# Patient Record
Sex: Female | Born: 1995
Health system: Southern US, Community
[De-identification: ages and names within clinical notes are randomized; demographics above are authoritative.]

## PROBLEM LIST (undated history)

## (undated) HISTORY — PX: WISDOM TOOTH EXTRACTION: SHX21

---
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
## 2013-10-13 ENCOUNTER — Encounter: Payer: Self-pay | Admitting: Obstetrics & Gynecology

## 2013-10-13 ENCOUNTER — Ambulatory Visit (INDEPENDENT_AMBULATORY_CARE_PROVIDER_SITE_OTHER): Admitting: Obstetrics & Gynecology

## 2013-10-13 VITALS — BP 118/74 | HR 67 | Temp 98.6°F | Ht 65.0 in | Wt 159.0 lb

## 2013-10-13 DIAGNOSIS — Z01419 Encounter for gynecological examination (general) (routine) without abnormal findings: Secondary | ICD-10-CM

## 2013-10-13 DIAGNOSIS — Z113 Encounter for screening for infections with a predominantly sexual mode of transmission: Secondary | ICD-10-CM

## 2013-10-13 NOTE — Progress Notes (Signed)
Subjective:     Holly Tran is a 17 y.o. female here for a routine annual exam.  Current complaints: Pt does frequently have yeast infections. Pt would like STD testing at today's visit.  Pt currently satisfied with her birth control. No other complaints today.  Personal health questionnaire reviewed: yes.   Gynecologic History Patient's last menstrual period was 10/06/2013. Contraception: pill Last Pap:  No previous paps Last mammogram: n/a  Obstetric History OB History  Gravida Para Term Preterm AB SAB TAB Ectopic Multiple Living  0 0 0 0 0 0 0 0 0 0          The following portions of the patient's history were reviewed and updated as appropriate: allergies, current medications, past family history, past medical history, past social history, past surgical history and problem list.  Review of Systems Pertinent items are noted in HPI.    Objective:   General appearance: alert Breasts: normal appearance, no masses or tenderness Abdomen: soft, non-tender; bowel sounds normal; no masses,  no organomegaly Pelvic: cervix normal in appearance, external genitalia normal, no adnexal masses or tenderness, uterus normal size, shape, and consistency and vagina normal without discharge      Assessment:    Healthy female exam.    Plan:    Return in 75yr

## 2013-10-13 NOTE — Patient Instructions (Signed)
Health Maintenance, 18- to 19-Year-Old SCHOOL PERFORMANCE After high school completion, the young adult may be attending college, technical or vocational school, or entering the military or the work force. SOCIAL AND EMOTIONAL DEVELOPMENT The young adult establishes adult relationships and explores sexual identity. Young adults may be living at home or in a college dorm or apartment. Increasing independence is important with young adults. Throughout these years, young adults should assume responsibility of their own health care. RECOMMENDED IMMUNIZATIONS  Influenza vaccine.  All adults should be immunized every year.  All adults, including pregnant women and people with hives-only allergy to eggs can receive the inactivated influenza (IIV) vaccine.  Adults aged 18 49 years can receive the recombinant influenza (RIV) vaccine. The RIV vaccine does not contain any egg protein.  Tetanus, diphtheria, and acellular pertussis (Td, Tdap) vaccine.  Pregnant women should receive 1 dose of Tdap vaccine during each pregnancy. The dose should be obtained regardless of the length of time since the last dose. Immunization is preferred during the 27th to 36th week of gestation.  An adult who has not previously received Tdap or who does not know his or her vaccine status should receive 1 dose of Tdap. This initial dose should be followed by tetanus and diphtheria toxoids (Td) booster doses every 10 years.  Adults with an unknown or incomplete history of completing a 3-dose immunization series with Td-containing vaccines should begin or complete a primary immunization series including a Tdap dose.  Adults should receive a Td booster every 10 years.  Varicella vaccine.  An adult without evidence of immunity to varicella should receive 2 doses or a second dose if he or she has previously received 1 dose.  Pregnant females who do not have evidence of immunity should receive the first dose after pregnancy.  This first dose should be obtained before leaving the health care facility. The second dose should be obtained 4 8 weeks after the first dose.  Human papillomavirus (HPV) vaccine.  Females aged 13 26 years who have not received the vaccine previously should obtain the 3-dose series.  The vaccine is not recommended for use in pregnant females. However, pregnancy testing is not needed before receiving a dose. If a female is found to be pregnant after receiving a dose, no treatment is needed. In that case, the remaining doses should be delayed until after the pregnancy.  Males aged 13 21 years who have not received the vaccine previously should receive the 3-dose series. Males aged 22 26 years may be immunized.  Immunization is recommended through the age of 26 years for any female who has sex with males and did not get any or all doses earlier.  Immunization is recommended for any person with an immunocompromised condition through the age of 26 years if he or she did not get any or all doses earlier.  During the 3-dose series, the second dose should be obtained 4 8 weeks after the first dose. The third dose should be obtained 24 weeks after the first dose and 16 weeks after the second dose.  Measles, mumps, and rubella (MMR) vaccine.  Adults born in 1957 or later should have 1 or more doses of MMR vaccine unless there is a contraindication to the vaccine or there is laboratory evidence of immunity to each of the three diseases.  A routine second dose of MMR vaccine should be obtained at least 28 days after the first dose for students attending postsecondary schools, health care workers, or international travelers.    For females of childbearing age, rubella immunity should be determined. If there is no evidence of immunity, females who are not pregnant should be vaccinated. If there is no evidence of immunity, females who are pregnant should delay immunization until after pregnancy.  Pneumococcal  13-valent conjugate (PCV13) vaccine.  When indicated, a person who is uncertain of his or her immunization history and has no record of immunization should receive the PCV13 vaccine.  An adult aged 19 years or older who has certain medical conditions and has not been previously immunized should receive 1 dose of PCV13 vaccine. This PCV13 should be followed with a dose of pneumococcal polysaccharide (PPSV23) vaccine. The PPSV23 vaccine dose should be obtained at least 8 weeks after the dose of PCV13 vaccine.  An adult aged 19 years or older who has certain medical conditions and previously received 1 or more doses of PPSV23 vaccine should receive 1 dose of PCV13. The PCV13 vaccine dose should be obtained 1 or more years after the last PPSV23 vaccine dose.  Pneumococcal polysaccharide (PPSV23) vaccine.  When PCV13 is also indicated, PCV13 should be obtained first.  An adult younger than age 65 years who has certain medical conditions should be immunized.  Any person who resides in a nursing home or long-term care facility should be immunized.  An adult smoker should be immunized.  People with an immunocompromised condition and certain other conditions should receive both PCV13 and PPSV23 vaccines.  People with human immunodeficiency virus (HIV) infection should be immunized as soon as possible after diagnosis.  Immunization during chemotherapy or radiation therapy should be avoided.  Routine use of PPSV23 vaccine is not recommended for American Indians, [place] Natives, or people younger than 65 years unless there are medical conditions that require PPSV23 vaccine.  When indicated, people who have unknown immunization and have no record of immunization should receive PPSV23 vaccine.  One-time revaccination 5 years after the first dose of PPSV23 is recommended for people aged 19 64 years who have chronic kidney failure, nephrotic syndrome, asplenia, or immunocompromised  conditions.  Meningococcal vaccine.  Adults with asplenia or persistent complement component deficiencies should receive 2 doses of quadrivalent meningococcal conjugate (MenACWY-D) vaccine. The doses should be obtained at least 2 months apart.  Microbiologists working with certain meningococcal bacteria, military recruits, people at risk during an outbreak, and people who travel to or live in countries with a high rate of meningitis should be immunized.  A first-year college student up through age 19 years who is living in a residence hall should receive a dose if he or she did not receive a dose on or after his or her 16th birthday.  Adults who have certain high-risk conditions should receive one or more doses of vaccine.  Hepatitis A vaccine.  Adults who wish to be protected from this disease, have certain high-risk conditions, work with hepatitis A-infected animals, work in hepatitis A research labs, or travel to or work in countries with a high rate of hepatitis A should be immunized.  Adults who were previously unvaccinated and who anticipate close contact with an international adoptee during the first 60 days after arrival in the [place] from a country with a high rate of hepatitis A should be immunized.  Hepatitis B vaccine.  Adults who wish to be protected from this disease, have certain high-risk conditions, may be exposed to blood or other infectious body fluids, are household contacts or sex partners of hepatitis B positive people, are clients or workers in   certain care facilities, or travel to or work in countries with a high rate of hepatitis B should be immunized.  Haemophilus influenzae type b (Hib) vaccine.  A previously unvaccinated person with asplenia or sickle cell disease or having a scheduled splenectomy should receive 1 dose of Hib vaccine.  Regardless of previous immunization, a recipient of a hematopoietic stem cell transplant should receive a 3-dose series 6  12 months after his or her successful transplant.  Hib vaccine is not recommended for adults with HIV infection. TESTING Annual screening for vision and hearing problems is recommended. Vision should be screened objectively at least once between 18 19 years of age. The young adult may be screened for anemia or tuberculosis. Young adults should have a blood test to check for high cholesterol during this time period. Young adults should be screened for use of alcohol and drugs. If the young adult is sexually active, screening for sexually transmitted infections, pregnancy, or HIV may be performed.  NUTRITION AND ORAL HEALTH  Adequate calcium intake is important. Consume 3 servings of low-fat milk and dairy products daily. For those who do not drink milk or consume dairy products, calcium enriched foods, such as juice, bread, or cereal, dark, leafy greens, or canned fish are alternate sources of calcium.  Drink plenty of water. Limit fruit juice to 8 12 ounces (240 360 mL) each day. Avoid sugary beverages or sodas.  Discourage skipping meals, especially breakfast. Young adults should eat a good variety of vegetables and fruits, as well as lean meats.  Avoid foods high in fat, salt, or sugar, such as candy, chips, and cookies.  Encourage young adults to participate in meal planning and preparation.  Eat meals together as a family whenever possible. Encourage conversation at mealtime.  Limit fast food choices and eating out at restaurants.  Brush teeth twice a day and floss.  Schedule dental exams twice a year. SLEEP Regular sleep habits are important. PHYSICAL, SOCIAL, AND EMOTIONAL DEVELOPMENT  One hour of regular physical activity daily is recommended. Continue to participate in sports.  Encourage young adults to develop their own interests and consider community service or volunteerism.  Provide guidance to the young adult in making decisions about college and work plans.  Make sure  that young adults know that they should never be in a situation that makes them uncomfortable, and they should tell partners if they do not want to engage in sexual activity.  Talk to the young adult about body image. Eating disorders may be noted at this time. Young adults may also be concerned about being overweight. Monitor the young adult for weight gain or loss.  Mood disturbances, depression, anxiety, alcoholism, or attention problems may be noted in young adults. Talk to the caregiver if there are concerns about mental illness.  Negotiate limit setting and independent decision making.  Encourage the young adult to handle conflict without physical violence.  Avoid loud noises which may impair hearing.  Limit television and computer time to 2 hours each day. Individuals who engage in excessive sedentary activity are more likely to become overweight. RISK BEHAVIORS  Sexually active young adults need to take precautions against pregnancy and sexually transmitted infections. Talk to young adults about contraception.  Provide a tobacco-free and drug-free environment for the young adult. Talk to the young adult about drug, tobacco, and alcohol use among friends or at friend's homes. Make sure the young adult knows that smoking tobacco or marijuana and taking drugs have health consequences and   may impact brain development.  Teach the young adult about appropriate use of over-the-counter or prescription medicines.  Establish guidelines for driving and for riding with friends.  Talk to young adults about the risks of drinking and driving or boating. Encourage the young adult to call you if he or she or friends have been drinking or using drugs.  Remind young adults to wear seat belts at all times in cars and life vests in boats.  Young adults should always wear a properly fitted helmet when they are riding a bicycle.  Use caution with all-terrain vehicles (ATVs) or other motorized  vehicles.  Do not keep handguns in the home. (If you do, the gun and ammunition should be locked separately and out of the young adult's access.)  Equip your home with smoke detectors and change the batteries regularly. Make sure all family members know the fire escape plans for your home.  Teach young adults not to swim alone and not to dive in shallow water.  All individuals should wear sunscreen when out in the sun. This minimizes sunburning. WHAT'S NEXT? Young adults should visit their pediatrician or family physician yearly. By young adulthood, health care should be transitioned to a family physician or internal medicine specialist. Sexually active females may want to begin annual physical exams with a gynecologist. Document Released: 01/09/2007 Document Revised: 02/08/2013 Document Reviewed: 01/29/2007 [employer] Patient Information 2014 [employer].  

## 2013-10-14 LAB — WET PREP BY MOLECULAR PROBE
Candida species: NEGATIVE
Gardnerella vaginalis: NEGATIVE
Trichomonas vaginosis: NEGATIVE

## 2013-10-14 LAB — GC/CHLAMYDIA PROBE AMP: GC Probe RNA: NEGATIVE

## 2014-08-10 ENCOUNTER — Encounter: Payer: Self-pay | Admitting: Obstetrics & Gynecology

## 2014-08-10 ENCOUNTER — Ambulatory Visit (INDEPENDENT_AMBULATORY_CARE_PROVIDER_SITE_OTHER): Admitting: Obstetrics & Gynecology

## 2014-08-10 VITALS — BP 123/77 | HR 89 | Temp 98.8°F | Ht 65.0 in | Wt 170.0 lb

## 2014-08-10 DIAGNOSIS — N760 Acute vaginitis: Secondary | ICD-10-CM

## 2014-08-10 MED ORDER — METRONIDAZOLE 500 MG PO TABS: 500.0000 mg | ORAL_TABLET | Freq: Two times a day (BID) | ORAL | Status: DC

## 2014-08-10 MED ORDER — METRONIDAZOLE 500 MG PO TABS: 500.0000 mg | ORAL_TABLET | Freq: Three times a day (TID) | ORAL | Status: DC

## 2014-08-11 LAB — HIV ANTIBODY (ROUTINE TESTING W REFLEX): HIV 1&2 Ab, 4th Generation: NONREACTIVE

## 2014-08-11 LAB — WET PREP BY MOLECULAR PROBE
CANDIDA SPECIES: NEGATIVE
GARDNERELLA VAGINALIS: POSITIVE — AB
TRICHOMONAS VAG: NEGATIVE

## 2014-08-11 LAB — GC/CHLAMYDIA PROBE AMP
CT Probe RNA: NEGATIVE
GC Probe RNA: NEGATIVE

## 2014-08-11 LAB — RPR

## 2014-08-12 NOTE — Patient Instructions (Signed)

## 2014-08-12 NOTE — Progress Notes (Signed)
Patient ID: Holly Tran, female   DOB: August 16, 1996, 18 y.o.   MRN: [id number]  Chief Complaint  Patient presents with  . Problem    Possible Yeast Infection     HPI Holly Greggory StallionGeorge is Tran 18 y.o. female.  C/O vaginal discharge, irritation.  HPI  History reviewed. No pertinent past medical history.  History reviewed. No pertinent past surgical history.  Family History  Problem Relation Age of Onset  . Diabetes Mother   . Diabetes Brother   . Diabetes Maternal Grandmother   . Cancer Maternal Grandmother   . Diabetes Maternal Grandfather   . Cancer Paternal Grandmother     Social History History  Substance Use Topics  . Smoking status: Light Tobacco Smoker    Types: Cigarettes  . Smokeless tobacco: Never Used     Comment: smokes occasionally  . Alcohol Use: No    No Known Allergies  Current Outpatient Prescriptions  Medication Sig Dispense Refill  . naproxen (NAPROSYN) 250 MG tablet Take by mouth.      . metroNIDAZOLE (FLAGYL) 500 MG tablet Take 1 tablet (500 mg total) by mouth 2 (two) times daily.  14 tablet  0   No current facility-administered medications for this visit.    Review of Systems Review of Systems Constitutional: negative for fatigue and weight loss Respiratory: negative for cough and wheezing Cardiovascular: negative for chest pain, fatigue and palpitations Gastrointestinal: negative for abdominal pain and change in bowel habits Genitourinary:positive for abnormal vaginal discharge Integument/breast: negative for nipple discharge Musculoskeletal:negative for myalgias Neurological: negative for gait problems and tremors Behavioral/Psych: negative for abusive relationship, depression Endocrine: negative for temperature intolerance     Blood pressure 123/77, pulse 89, temperature 98.8 F (37.1 C), height 5\' 5"  (1.651 m), weight 77.111 kg (170 lb), last menstrual period 08/10/2014.  Physical Exam Physical Exam General:   alert  Skin:   no rash or  abnormalities  Lungs:   clear to auscultation bilaterally  Heart:   regular rate and rhythm, S1, S2 normal, no murmur, click, rub or gallop  Abdomen:  normal findings: no organomegaly, soft, non-tender and no hernia  Pelvis:  External genitalia: normal general appearance Urinary system: urethral meatus normal and bladder without fullness, nontender Vaginal: thin, white discharge--malodorous, strong amine odor Cervix: normal appearance Adnexa: normal bimanual exam Uterus: anteverted and non-tender, normal size      Data Reviewed none  Assessment    Likely bacterial vaginosis     Plan    Orders Placed This Encounter  Procedures  . WET PREP BY MOLECULAR PROBE  . GC/Chlamydia Probe Amp  . HIV antibody  . RPR   Meds ordered this encounter  Medications  . DISCONTD: metroNIDAZOLE (FLAGYL) 500 MG tablet    Sig: Take 1 tablet (500 mg total) by mouth 3 (three) times daily.    Dispense:  14 tablet    Refill:  0  . metroNIDAZOLE (FLAGYL) 500 MG tablet    Sig: Take 1 tablet (500 mg total) by mouth 2 (two) times daily.    Dispense:  14 tablet    Refill:  0     Follow up as needed.         Holly Tran 08/12/2014, 11:37 AM

## 2014-08-22 ENCOUNTER — Telehealth: Payer: Self-pay | Admitting: *Deleted

## 2014-08-22 NOTE — Telephone Encounter (Signed)
Pt placed call to office for lab results. Return call to pt. Pt made aware of results and advised to take Rx that she was given at time of visit.

## 2014-09-01 ENCOUNTER — Ambulatory Visit (INDEPENDENT_AMBULATORY_CARE_PROVIDER_SITE_OTHER): Admitting: Physician Assistant

## 2014-09-01 VITALS — BP 126/80 | HR 89 | Temp 98.4°F | Resp 16 | Ht 65.0 in | Wt 169.2 lb

## 2014-09-01 DIAGNOSIS — L089 Local infection of the skin and subcutaneous tissue, unspecified: Secondary | ICD-10-CM

## 2014-09-01 MED ORDER — DOXYCYCLINE HYCLATE 100 MG PO CAPS: 100.0000 mg | ORAL_CAPSULE | Freq: Two times a day (BID) | ORAL | Status: AC

## 2014-09-01 NOTE — Patient Instructions (Addendum)
Stay out of the sun while taking the doxycycline.     You may take advil for pain.    If you witness worsening swelling at the site, muscle ache or fever, return to the clinic.

## 2014-09-01 NOTE — Progress Notes (Signed)
   Subjective:    Patient ID: Holly Tran, female    DOB: August 25, 1996, 18 y.o.   MRN: [id number]   HPI  18 year old female is here today for a bump on her leg.   This began yesterday after visiting a friend's house.  When she got home, she noticed that she had a swollen bump on the left side of her buttocks.  She said it was painful to the touch.  There is a punctured site which she said had some pus drainage. She tried to put alcohol on it, which decreased the swelling, but it still hurts.  She denies fever, myalgia, malaise, or rash.  She denies that it was began target-like in appearance--just a general circular swelling under the skin.  She does not note any sign of tics throughout the last few weeks.  She did not feel the actual bite, or notice any spiders or insects.   She is not on any other medications, no use of anti-inflammatories.  She had chills and myalgia which prompted her to come here today.   Review of Systems ROS otherwise normal unless listed above    Objective:   Physical Exam  Constitutional: She is oriented to person, place, and time. She appears well-developed and well-nourished. No distress.  BP 126/80 mmHg  Pulse 89  Temp(Src) 98.4 F (36.9 C) (Oral)  Resp 16  Ht 5\' 5"  (1.651 m)  Wt 169 lb 3.2 oz (76.749 kg)  BMI 28.16 kg/m2  SpO2 98%  LMP 08/10/2014   HENT:  Head: Normocephalic and atraumatic.  Eyes: Pupils are equal, round, and reactive to light.  Cardiovascular: Normal rate, normal heart sounds and intact distal pulses.   Pulmonary/Chest: Effort normal and breath sounds normal. No respiratory distress. She has no wheezes.  Lymphadenopathy:       Right: No inguinal adenopathy present.       Left: No inguinal adenopathy present.  Neurological: She is alert and oriented to person, place, and time. She displays normal reflexes (Normal lower extremity DTRs).  Skin: Skin is warm and dry. No rash noted.  Left lateral gluteal swelling 2 inches diameter.     Around a puncture site.  No erythema.  Puncture site about 2mm wide.  No erythema migrans.    Psychiatric: She has a normal mood and affect. Her behavior is normal. Judgment and thought content normal.          Assessment & Plan:  18 year old female is here today for complaint of possible spider bite.  This appears like a bite of unknown organism, but it also looks like cellulitis, though there is a definite location of entry for the pathogen that is causing this inflammation.  We will treat her with comparable antibiotic for both an insect bite while covering for MRSA.    Skin infection - Plan: doxycycline (VIBRAMYCIN) 100 MG capsule 10 days BID She will return if symptoms worsen or fail to improve.  Trena PlattStephanie English, PA-C [facility] 11/5/20157:48 PM

## 2014-09-02 NOTE — Progress Notes (Signed)
I was directly involved with the patient's care and agree with the physical, diagnosis and treatment plan.  

## 2014-10-13 ENCOUNTER — Ambulatory Visit (INDEPENDENT_AMBULATORY_CARE_PROVIDER_SITE_OTHER): Admitting: Obstetrics & Gynecology

## 2014-10-13 ENCOUNTER — Encounter: Payer: Self-pay | Admitting: Obstetrics & Gynecology

## 2014-10-13 ENCOUNTER — Ambulatory Visit: Admitting: Obstetrics & Gynecology

## 2014-10-13 ENCOUNTER — Other Ambulatory Visit: Payer: Self-pay | Admitting: Obstetrics & Gynecology

## 2014-10-13 VITALS — BP 126/78 | HR 84 | Temp 98.6°F | Ht 66.0 in | Wt 171.0 lb

## 2014-10-13 DIAGNOSIS — Z01419 Encounter for gynecological examination (general) (routine) without abnormal findings: Secondary | ICD-10-CM

## 2014-10-13 LAB — CBC
HEMATOCRIT: 36.3 % (ref 36.0–46.0)
Hemoglobin: 11.7 g/dL — ABNORMAL LOW (ref 12.0–15.0)
MCH: 25.3 pg — ABNORMAL LOW (ref 26.0–34.0)
MCHC: 32.2 g/dL (ref 30.0–36.0)
MCV: 78.6 fL (ref 78.0–100.0)
MPV: 10 fL (ref 9.4–12.4)
Platelets: 367 10*3/uL (ref 150–400)
RBC: 4.62 MIL/uL (ref 3.87–5.11)
RDW: 13.6 % (ref 11.5–15.5)
WBC: 7.2 10*3/uL (ref 4.0–10.5)

## 2014-10-13 NOTE — Patient Instructions (Signed)
You Can Quit Smoking If you are ready to quit smoking or are thinking about it, congratulations! You have chosen to help yourself be healthier and live longer! There are lots of different ways to quit smoking. Nicotine gum, nicotine patches, a nicotine inhaler, or nicotine nasal spray can help with physical craving. Hypnosis, support groups, and medicines help break the habit of smoking. TIPS TO GET OFF AND STAY OFF CIGARETTES  Learn to predict your moods. Do not let a bad situation be your excuse to have a cigarette. Some situations in your life might tempt you to have a cigarette.  Ask friends and co-workers not to smoke around you.  Make your home smoke-free.  Never have "just one" cigarette. It leads to wanting another and another. Remind yourself of your decision to quit.  On a card, make a list of your reasons for not smoking. Read it at least the same number of times a day as you have a cigarette. Tell yourself everyday, "I do not want to smoke. I choose not to smoke."  Ask someone at home or work to help you with your plan to quit smoking.  Have something planned after you eat or have a cup of coffee. Take a walk or get other exercise to perk you up. This will help to keep you from overeating.  Try a relaxation exercise to calm you down and decrease your stress. Remember, you may be tense and nervous the first two weeks after you quit. This will pass.  Find new activities to keep your hands busy. Play with a pen, coin, or rubber band. Doodle or draw things on paper.  Brush your teeth right after eating. This will help cut down the craving for the taste of tobacco after meals. You can try mouthwash too.  Try gum, breath mints, or diet candy to keep something in your mouth. IF YOU SMOKE AND WANT TO QUIT:  Do not stock up on cigarettes. Never buy a carton. Wait until one pack is finished before you buy another.  Never carry cigarettes with you at work or at home.  Keep cigarettes  as far away from you as possible. Leave them with someone else.  Never carry matches or a lighter with you.  Ask yourself, "Do I need this cigarette or is this just a reflex?"  Bet with someone that you can quit. Put cigarette money in a piggy bank every morning. If you smoke, you give up the money. If you do not smoke, by the end of the week, you keep the money.  Keep trying. It takes 21 days to change a habit!  Talk to your doctor about using medicines to help you quit. These include nicotine replacement gum, lozenges, or skin patches. Document Released: 08/10/2009 Document Revised: 01/06/2012 Document Reviewed: 08/10/2009 Exit[employer] Patient Information 2015 [employer]. This information is not intended to replace advice given to you by your health care provider. Make sure you discuss any questions you have with your health care provider.  

## 2014-10-13 NOTE — Progress Notes (Signed)
Subjective:     Holly Tran is a 18 y.o. female here for a routine exam.     Personal health questionnaire:  Is patient [name], have a family history of breast and/or ovarian cancer: no Is there a family history of uterine cancer diagnosed at age < 6250, gastrointestinal cancer, urinary tract cancer, family member who is a Personnel officerLynch syndrome-associated carrier: no Is the patient overweight and hypertensive, family history of diabetes, personal history of gestational diabetes or PCOS: yes Is patient over 4055, have PCOS,  family history of premature CHD under age 20, diabetes, smoke, have hypertension or peripheral artery disease:  yes At any time, has a partner hit, kicked or otherwise hurt or frightened you?: no Over the past 2 weeks, have you felt down, depressed or hopeless?: no Over the past 2 weeks, have you felt little interest or pleasure in doing things?:no   Gynecologic History Patient's last menstrual period was 09/29/2014.   Obstetric History OB History  Gravida Para Term Preterm AB SAB TAB Ectopic Multiple Living  0 0 0 0 0 0 0 0 0 0         History reviewed. No pertinent past medical history.  History reviewed. No pertinent past surgical history.  Current outpatient prescriptions: naproxen (NAPROSYN) 250 MG tablet, Take by mouth., Disp: , Rfl:  No Known Allergies  History  Substance Use Topics  . Smoking status: Light Tobacco Smoker    Types: Cigarettes  . Smokeless tobacco: Never Used     Comment: smokes occasionally  . Alcohol Use: No    Family History  Problem Relation Age of Onset  . Diabetes Mother   . Diabetes Brother   . Diabetes Maternal Grandmother   . Cancer Maternal Grandmother   . Diabetes Maternal Grandfather   . Cancer Paternal Grandmother       Review of Systems  Constitutional: negative for fatigue and weight loss Respiratory: negative for cough and wheezing Cardiovascular: negative for chest pain, fatigue and  palpitations Gastrointestinal: negative for abdominal pain and change in bowel habits Musculoskeletal:negative for myalgias Neurological: negative for gait problems and tremors Behavioral/Psych: negative for abusive relationship, depression Endocrine: negative for temperature intolerance   Genitourinary:negative for abnormal menstrual periods, genital lesions, hot flashes, sexual problems and vaginal discharge Integument/breast: negative for breast lump, breast tenderness, nipple discharge and skin lesion(s)    Objective:       BP 126/78 mmHg  Pulse 84  Temp(Src) 98.6 F (37 C)  Ht 5\' 6"  (1.676 m)  Wt 77.565 kg (171 lb)  BMI 27.61 kg/m2  LMP 09/29/2014 General:   alert  Skin:   no rash or abnormalities  Lungs:   clear to auscultation bilaterally  Heart:   regular rate and rhythm, S1, S2 normal, no murmur, click, rub or gallop  Breasts:   normal without suspicious masses, skin or nipple changes or axillary nodes  Abdomen:  normal findings: no organomegaly, soft, non-tender and no hernia  Pelvis:  External genitalia: normal general appearance Urinary system: urethral meatus normal and bladder without fullness, nontender Vaginal: normal without tenderness, induration or masses Cervix: normal appearance Adnexa: normal bimanual exam Uterus: anteverted and non-tender, normal size   Lab Review Urine pregnancy test Labs reviewed no Radiologic studies reviewed no     Assessment:    Healthy female exam.    Plan:    Education reviewed: calcium supplements, low fat, low cholesterol diet, safe sex/STD prevention, smoking cessation, weight bearing exercise and contraceptive options.   No  orders of the defined types were placed in this encounter.   Orders Placed This Encounter  Procedures  . C. trachomatis/N. gonorrhoeae RNA  . CBC  . Hemoglobin A1c    Follow up as needed.

## 2014-10-14 LAB — HEMOGLOBIN A1C
Hgb A1c MFr Bld: 5.6 % (ref <5.7)
MEAN PLASMA GLUCOSE: 114 mg/dL (ref <117)

## 2014-10-14 LAB — GC/CHLAMYDIA PROBE AMP
CT Probe RNA: NEGATIVE
GC Probe RNA: NEGATIVE

## 2014-10-24 ENCOUNTER — Encounter: Payer: Self-pay | Admitting: *Deleted

## 2014-10-25 ENCOUNTER — Encounter: Payer: Self-pay | Admitting: Obstetrics & Gynecology

## 2015-01-02 ENCOUNTER — Ambulatory Visit (INDEPENDENT_AMBULATORY_CARE_PROVIDER_SITE_OTHER): Admitting: Physician Assistant

## 2015-01-02 VITALS — BP 108/78 | HR 90 | Temp 98.0°F | Resp 16 | Ht 65.0 in | Wt 167.8 lb

## 2015-01-02 DIAGNOSIS — L298 Other pruritus: Secondary | ICD-10-CM

## 2015-01-02 DIAGNOSIS — N898 Other specified noninflammatory disorders of vagina: Secondary | ICD-10-CM

## 2015-01-02 DIAGNOSIS — B379 Candidiasis, unspecified: Secondary | ICD-10-CM

## 2015-01-02 LAB — POCT URINALYSIS DIPSTICK
BILIRUBIN UA: NEGATIVE
Blood, UA: NEGATIVE
Glucose, UA: NEGATIVE
KETONES UA: NEGATIVE
Nitrite, UA: NEGATIVE
Protein, UA: NEGATIVE
SPEC GRAV UA: 1.025
Urobilinogen, UA: 0.2
pH, UA: 7

## 2015-01-02 LAB — POCT WET PREP WITH KOH
CLUE CELLS WET PREP PER HPF POC: NEGATIVE
KOH Prep POC: POSITIVE
RBC Wet Prep HPF POC: NEGATIVE
TRICHOMONAS UA: NEGATIVE

## 2015-01-02 MED ORDER — FLUCONAZOLE 150 MG PO TABS: 150.0000 mg | ORAL_TABLET | Freq: Once | ORAL | Status: DC

## 2015-01-02 MED ORDER — GERHARDT'S BUTT CREAM: 1.0000 "application " | TOPICAL_CREAM | Freq: Two times a day (BID) | CUTANEOUS | Status: DC

## 2015-01-02 NOTE — Patient Instructions (Signed)

## 2015-01-02 NOTE — Progress Notes (Signed)
[facility] [address], [place] [address] [phone]  Date:  01/02/2015   Name:  Holly GaribaldiRagine Kithcart   DOB:  11/17/1995   MRN:  [id number]  PCP:  No PCP Per Patient    Chief Complaint: Vaginitis   History of Present Illness:  Holly Greggory StallionGeorge is a 19 y.o. very pleasant female patient who presents with the following:  Patient reports 1.5 weeks of intermittent abnormal vaginal discharge and pruritus.  She describes the discharge as whitish yellow, creamy texture.  Volume of discharge is the same.  She states that the odor is slightly musty.  She states that her period started 5 days ago, and the discharge stopped, but resumed a day after period ended.  She used the monistat which then started a burning irritation along the vaginal area.  She has some burning sensation after using the monistat, but this was more after the urination touched her genital area, along the skin.  She denies abdominal pain, nausea, vomiting, back pain, or fever.    Sexually active: unprotected before her period, norfel, with one partner.    LMP: 5 days ago.  February 28th-3rd     December: last pap normal, has annual   There are no active problems to display for this patient.   History reviewed. No pertinent past medical history.  History reviewed. No pertinent past surgical history.  History  Substance Use Topics  . Smoking status: Light Tobacco Smoker    Types: Cigarettes  . Smokeless tobacco: Never Used     Comment: smokes occasionally  . Alcohol Use: No    Family History  Problem Relation Age of Onset  . Diabetes Mother   . Diabetes Brother   . Diabetes Maternal Grandmother   . Cancer Maternal Grandmother   . Diabetes Maternal Grandfather   . Cancer Paternal Grandmother     No Known Allergies  Medication list has been reviewed and updated.  Current Outpatient Prescriptions on File Prior to Visit  Medication Sig Dispense Refill  . naproxen (NAPROSYN) 250 MG tablet  Take by mouth.     No current facility-administered medications on file prior to visit.    Review of Systems: ROS otherwise unremarkable unless listed above.    Physical Examination: Filed Vitals:   01/02/15 1254  BP: 108/78  Pulse: 90  Temp: 98 F (36.7 C)  Resp: 16   Filed Vitals:   01/02/15 1254  Height: 5\' 5"  (1.651 m)  Weight: 167 lb 12.8 oz (76.114 kg)   Body mass index is 27.92 kg/(m^2). Ideal Body Weight: Weight in (lb) to have BMI = 25: 149.9  Physical Exam  Constitutional: She is oriented to person, place, and time. She appears well-developed and well-nourished.  HENT:  Head: Normocephalic and atraumatic.  Eyes: EOM are normal. Pupils are equal, round, and reactive to light.  Cardiovascular: Normal rate and regular rhythm.   No murmur heard. Pulmonary/Chest: Effort normal. No respiratory distress.  Abdominal: Soft. Bowel sounds are normal. She exhibits no distension. There is no tenderness.  Genitourinary: Pelvic exam was performed with patient supine. There is rash (Very minimal erythema and inflammation just parallel to introitus.  ) on the right labia. There is no tenderness on the right labia. There is rash (Very minimal erythema and inflammation just parallel to introitus.  ) on the left labia. There is no tenderness on the left labia. Cervix exhibits no motion tenderness, no discharge and no friability. No erythema, tenderness or bleeding  in the vagina. Vaginal discharge (White, curd-like discharge) found.  Neurological: She is alert and oriented to person, place, and time.     Results for orders placed or performed in visit on 01/02/15  POCT urinalysis dipstick  Result Value Ref Range   Color, UA yellow    Clarity, UA clear    Glucose, UA neg    Bilirubin, UA neg    Ketones, UA neg    Spec Grav, UA 1.025    Blood, UA neg    pH, UA 7.0    Protein, UA neg    Urobilinogen, UA 0.2    Nitrite, UA neg    Leukocytes, UA small (1+)   POCT Wet Prep with  KOH  Result Value Ref Range   Trichomonas, UA Negative    Clue Cells Wet Prep HPF POC neg    Epithelial Wet Prep HPF POC 8-14    Yeast Wet Prep HPF POC buds    Bacteria Wet Prep HPF POC 4+    RBC Wet Prep HPF POC neg    WBC Wet Prep HPF POC 3-8    KOH Prep POC Positive      Assessment and Plan: 19 year old female is here today for chief complaint of abnormal vaginal discharge and pruritis.  Advised to apply nystatin to vaginal area and diflucan one tablet for positive yeast.      Vaginal discharge - Plan: GC/Chlamydia Probe Amp, POCT Wet Prep with KOH  Vaginal pruritus - Plan: GC/Chlamydia Probe Amp, POCT urinalysis dipstick, POCT Wet Prep with KOH, Hydrocortisone (GERHARDT'S BUTT CREAM) CREA  Yeast infection - Plan: fluconazole (DIFLUCAN) 150 MG tablet, Hydrocortisone (GERHARDT'S BUTT CREAM) CREA  Trena Platt, PA-C [facility] 3/7/20162:12 PM

## 2015-01-03 LAB — GC/CHLAMYDIA PROBE AMP
CT PROBE, AMP APTIMA: NEGATIVE
GC Probe RNA: NEGATIVE

## 2015-08-31 ENCOUNTER — Telehealth: Payer: Self-pay

## 2015-08-31 NOTE — Telephone Encounter (Signed)
Patient is calling to see if she can get a note to excuse her from school on the day of her last visit.  Patient phone: [phone]

## 2015-08-31 NOTE — Telephone Encounter (Signed)
Patient states she had dialed the wrong facility for a recent visit for which to have her excuse.

## 2017-12-19 ENCOUNTER — Emergency Department (HOSPITAL_BASED_OUTPATIENT_CLINIC_OR_DEPARTMENT_OTHER)

## 2017-12-19 ENCOUNTER — Other Ambulatory Visit: Payer: Self-pay

## 2017-12-19 ENCOUNTER — Emergency Department (HOSPITAL_BASED_OUTPATIENT_CLINIC_OR_DEPARTMENT_OTHER)
Admission: EM | Admit: 2017-12-19 | Discharge: 2017-12-19 | Disposition: A | Discharge status: Home / Self Care | Attending: Emergency Medicine | Admitting: Emergency Medicine

## 2017-12-19 ENCOUNTER — Encounter (HOSPITAL_BASED_OUTPATIENT_CLINIC_OR_DEPARTMENT_OTHER): Payer: Self-pay | Admitting: *Deleted

## 2017-12-19 DIAGNOSIS — F17210 Nicotine dependence, cigarettes, uncomplicated: Secondary | ICD-10-CM | POA: Insufficient documentation

## 2017-12-19 DIAGNOSIS — R198 Other specified symptoms and signs involving the digestive system and abdomen: Secondary | ICD-10-CM

## 2017-12-19 DIAGNOSIS — K228 Other specified diseases of esophagus: Secondary | ICD-10-CM

## 2017-12-19 DIAGNOSIS — Z79899 Other long term (current) drug therapy: Secondary | ICD-10-CM | POA: Diagnosis not present

## 2017-12-19 DIAGNOSIS — R0989 Other specified symptoms and signs involving the circulatory and respiratory systems: Secondary | ICD-10-CM | POA: Diagnosis not present

## 2017-12-19 DIAGNOSIS — T17208A Unspecified foreign body in pharynx causing other injury, initial encounter: Secondary | ICD-10-CM | POA: Insufficient documentation

## 2017-12-19 MED ORDER — GI COCKTAIL ~~LOC~~
30.0000 mL | Freq: Once | ORAL | Status: AC
  Administered 2017-12-19: 30 mL via ORAL
  Filled 2017-12-19: qty 30

## 2017-12-19 NOTE — ED Triage Notes (Signed)
FB in her throat since eating fish last night. She is unable to see a fish bone but feels a stabbing when she swallows.

## 2017-12-19 NOTE — Discharge Instructions (Signed)
I spoke with Dr. Jenne PaneBates of ear nose and throat.  He would like you to call his office as soon as you leave the emergency department to schedule an appointment for later today.  He will evaluate further for her symptoms in the office.  You did receive an x-ray in the emergency department that was unremarkable.  Please return if you develop any fever, cough/vomit blood or unable to tolerate her secretions.

## 2017-12-19 NOTE — ED Provider Notes (Signed)
[facility] EMERGENCY DEPARTMENT Provider Note   CSN: [id number] Arrival date & time: 12/19/17  1053     History   Chief Complaint Chief Complaint  Patient presents with  . Foreign Body    HPI Holly Tran is a 22 y.o. female with no significant past medical history who presents the emergency department today for possible swallowed foreign body.  Patient states she was out eating sushi yesterday, but included salmon, fried shrimp and crab and while eating the last piece of her sushi and she felt like a small piece of bone scraped the back of her throat.  Since that time she has been feeling pain with swallowing in her esophagus at the level of her clavicle anatomically.  She notes that yesterday she self-induced vomiting in order to relieve this but was unsuccessful.  She notes that there is a small streak of blood in her vomit.  No emesis, hemoptysis or bleeding since then.  She is not take any medications prior to arrival.  She is in control of her secretions and without trismus.  HPI  History reviewed. No pertinent past medical history.  There are no active problems to display for this patient.   History reviewed. No pertinent surgical history.  OB History    Gravida Para Term Preterm AB Living   0 0 0 0 0 0   SAB TAB Ectopic Multiple Live Births   0 0 0 0         Home Medications    Prior to Admission medications   Medication Sig Start Date End Date Taking? Authorizing Provider  fluconazole (DIFLUCAN) 150 MG tablet Take 1 tablet (150 mg total) by mouth once. Repeat if needed 01/02/15   Trena Platt D, PA  Hydrocortisone (GERHARDT'S BUTT CREAM) CREA Apply 1 application topically 2 (two) times daily. 01/02/15   Trena Platt D, PA  naproxen (NAPROSYN) 250 MG tablet Take by mouth.    [provider]    Family History Family History  Problem Relation Age of Onset  . Diabetes Mother   . Diabetes Brother   . Diabetes Maternal Grandmother     . Cancer Maternal Grandmother   . Diabetes Maternal Grandfather   . Cancer Paternal Grandmother     Social History Social History   Tobacco Use  . Smoking status: Light Tobacco Smoker    Types: Cigarettes  . Smokeless tobacco: Never Used  . Tobacco comment: smokes occasionally  Substance Use Topics  . Alcohol use: No    Alcohol/week: 0.0 oz  . Drug use: No     Allergies   Patient has no known allergies.   Review of Systems Review of Systems  All other systems reviewed and are negative.    Physical Exam Updated Vital Signs BP 112/75   Pulse 96   Temp 98.4 F (36.9 C) (Oral)   Resp 18   Ht 5\' 5"  (1.651 m)   Wt 90.7 kg (200 lb)   LMP 11/28/2017   SpO2 100%   BMI 33.28 kg/m   Physical Exam  Constitutional: She appears well-developed and well-nourished. No distress.  HENT:  Head: Normocephalic and atraumatic.  Right Ear: Hearing, tympanic membrane, external ear and ear canal normal. No foreign bodies. Tympanic membrane is not injected, not perforated, not erythematous, not retracted and not bulging.  Left Ear: Hearing, tympanic membrane, external ear and ear canal normal. No foreign bodies. Tympanic membrane is not injected, not perforated, not erythematous, not retracted and not bulging.  Nose: Nose normal. No mucosal edema, rhinorrhea, sinus tenderness, septal deviation or nasal septal hematoma.  No foreign bodies. Right sinus exhibits no maxillary sinus tenderness and no frontal sinus tenderness. Left sinus exhibits no maxillary sinus tenderness and no frontal sinus tenderness.  The patient has normal phonation and is in control of secretions. No stridor.  Midline uvula without edema. Soft palate rises symmetrically. No evidence of trauma to the posterior pharynx. No trismus. No creptius on neck palpation. Mucus membranes moist.  Eyes: Conjunctivae, EOM and lids are normal. Pupils are equal, round, and reactive to light. Right eye exhibits no discharge. Left eye  exhibits no discharge. Right conjunctiva is not injected. Left conjunctiva is not injected.  Neck: Trachea normal, normal range of motion, full passive range of motion without pain and phonation normal. Neck supple. No spinous process tenderness and no muscular tenderness present. No neck rigidity. No tracheal deviation and normal range of motion present.    Cardiovascular: Normal rate and regular rhythm.  Pulmonary/Chest: Effort normal and breath sounds normal. No stridor. She has no wheezes.  Abdominal: Soft.  Lymphadenopathy:       Head (right side): No submental, no submandibular, no tonsillar, no preauricular, no posterior auricular and no occipital adenopathy present.       Head (left side): No submental, no submandibular, no tonsillar, no preauricular, no posterior auricular and no occipital adenopathy present.    She has no cervical adenopathy.  Neurological: She is alert.  Skin: Skin is warm and dry. No rash noted.  Psychiatric: She has a normal mood and affect.  Nursing note and vitals reviewed.    ED Treatments / Results  Labs (all labs ordered are listed, but only abnormal results are displayed) Labs Reviewed - No data to display  EKG  EKG Interpretation None       Radiology Dg Neck Soft Tissue  Result Date: 12/19/2017 CLINICAL DATA:  Possible fishbone stuck in throat.  Pain. EXAM: NECK SOFT TISSUES - 1+ VIEW COMPARISON:  No prior. FINDINGS: Calcific densities noted along the posterior larynx are most likely normal range of calcifications. No other focal abnormality identified. No soft tissue swelling. No acute bony abnormality. If symptoms persist CT can be obtained. IMPRESSION: No acute abnormality identified. Electronically Signed   By: Maisie Fus  Register   On: 12/19/2017 11:56    Procedures Procedures (including critical care time)  Medications Ordered in ED Medications  gi cocktail (Maalox,Lidocaine,Donnatal) (not administered)     Initial Impression /  Assessment and Plan / ED Course  I have reviewed the triage vital signs and the nursing notes.  Pertinent labs & imaging results that were available during my care of the patient were reviewed by me and considered in my medical decision making (see chart for details).     22 y.o. female with sensation of FB in esophagus (small fish bone) after eating sushi yesterday. She is currently tolerating secretions. Xray unremarkable. Posterior pharynx exam unremarkable. Airway intact. Lungs CTA b/l. Spoke with Dr. Jenne Pane of [facility] who recommended that patient call his office to schedule an appointment for later today where he will evaluate with laryngoscope.  Patient provided with GI cocktail in the department with relief of symptoms. Will d/c patient with plan as about. Specific return precautions discussed. Time was given for all questions to be answered. The patient verbalized understanding and agreement with plan. The patient appears safe for discharge home.  Final Clinical Impressions(s) / ED Diagnoses   Final diagnoses:  Sensation of foreign body in esophagus    ED Discharge Orders    None       Princella PellegriniMaczis, Firman Petrow M, PA-C 12/19/17 1330    Alvira MondaySchlossman, Erin, MD 12/21/17 [phone]

## 2018-02-03 ENCOUNTER — Ambulatory Visit (INDEPENDENT_AMBULATORY_CARE_PROVIDER_SITE_OTHER): Admitting: Certified Nurse Midwife

## 2018-02-03 ENCOUNTER — Encounter: Payer: Self-pay | Admitting: Certified Nurse Midwife

## 2018-02-03 VITALS — BP 119/71 | HR 92 | Ht 65.0 in | Wt 208.0 lb

## 2018-02-03 DIAGNOSIS — Z124 Encounter for screening for malignant neoplasm of cervix: Secondary | ICD-10-CM | POA: Diagnosis not present

## 2018-02-03 DIAGNOSIS — Z01411 Encounter for gynecological examination (general) (routine) with abnormal findings: Secondary | ICD-10-CM

## 2018-02-03 DIAGNOSIS — Z30011 Encounter for initial prescription of contraceptive pills: Secondary | ICD-10-CM | POA: Diagnosis not present

## 2018-02-03 DIAGNOSIS — R87810 Cervical high risk human papillomavirus (HPV) DNA test positive: Secondary | ICD-10-CM

## 2018-02-03 DIAGNOSIS — Z1151 Encounter for screening for human papillomavirus (HPV): Secondary | ICD-10-CM | POA: Diagnosis not present

## 2018-02-03 DIAGNOSIS — N939 Abnormal uterine and vaginal bleeding, unspecified: Secondary | ICD-10-CM | POA: Diagnosis not present

## 2018-02-03 DIAGNOSIS — N946 Dysmenorrhea, unspecified: Secondary | ICD-10-CM | POA: Diagnosis not present

## 2018-02-03 DIAGNOSIS — Z113 Encounter for screening for infections with a predominantly sexual mode of transmission: Secondary | ICD-10-CM

## 2018-02-03 DIAGNOSIS — N898 Other specified noninflammatory disorders of vagina: Secondary | ICD-10-CM

## 2018-02-03 DIAGNOSIS — Z01419 Encounter for gynecological examination (general) (routine) without abnormal findings: Secondary | ICD-10-CM

## 2018-02-03 MED ORDER — PRENATAL PLUS IRON 29-1 MG PO TABS: 1.0000 | ORAL_TABLET | Freq: Every day | ORAL | 12 refills | Status: DC

## 2018-02-03 MED ORDER — LEVONORGEST-ETH ESTRADIOL-IRON 0.1-20 MG-MCG(21) PO TABS: 1.0000 | ORAL_TABLET | Freq: Every day | ORAL | 12 refills | Status: DC

## 2018-02-03 NOTE — Progress Notes (Signed)
Subjective:        Holly Tran is a 22 y.o. female here for a routine exam.  Current complaints: AUB: periods lasting 8 days with nickel sized clots with severe cramping. Uses tampon and pad.  Has been on current birth control pill for a long time, periods are getting worse.  Reports vaginal dryness with sexual intercourse, white crumbly discharge afterwards.  Encouraged to use lubrication.    Same female partner.  Desires full STD screening.  Encouraged to change diet, exercise and take probiotics daily.    Personal health questionnaire:  Is patient Holly Tran, have a family history of breast and/or ovarian cancer: Yes; maternal grandmother Is there a family history of uterine cancer diagnosed at age < 31, gastrointestinal cancer, urinary tract cancer, family member who is a Personnel officer syndrome-associated carrier: Paternal grandmother colon cancer Is the patient overweight and hypertensive, family history of diabetes, personal history of gestational diabetes or PCOS: yes Is patient over 8, have PCOS,  family history of premature CHD under age 18, diabetes, smoke, have hypertension or peripheral artery disease:  no At any time, has a partner hit, kicked or otherwise hurt or frightened you?: no Over the past 2 weeks, have you felt down, depressed or hopeless?: no Over the past 2 weeks, have you felt little interest or pleasure in doing things?:no   Gynecologic History Patient's last menstrual period was 01/23/2018. Contraception: OCP (estrogen/progesterone) Last Pap: unknown. Results were: normal according to the patient Last mammogram: n/a <40, without significant family hx.  Obstetric History OB History  Gravida Para Term Preterm AB Living  0 0 0 0 0 0  SAB TAB Ectopic Multiple Live Births  0 0 0 0      History reviewed. No pertinent past medical history.  History reviewed. No pertinent surgical history.   Current Outpatient Medications:  .  levonorgestrel-ethinyl estradiol  (VIENVA) 0.1-20 MG-MCG tablet, Take 1 tablet by mouth daily., Disp: , Rfl:  .  Hydrocortisone (GERHARDT'S BUTT CREAM) CREA, Apply 1 application topically 2 (two) times daily. (Patient not taking: Reported on 02/03/2018), Disp: 1 each, Rfl: 0 .  Levonorgest-Eth Estrad-Fe Bisg (BALCOLTRA) 0.1-20 MG-MCG(21) TABS, Take 1 tablet by mouth daily., Disp: 28 tablet, Rfl: 12 .  naproxen (NAPROSYN) 250 MG tablet, Take by mouth., Disp: , Rfl:  .  Prenatal Vit-Iron Carbonyl-FA (PRENATAL PLUS IRON) 29-1 MG TABS, Take 1 tablet by mouth daily., Disp: 30 tablet, Rfl: 12 No Known Allergies  Social History   Tobacco Use  . Smoking status: Light Tobacco Smoker    Types: Cigarettes  . Smokeless tobacco: Never Used  . Tobacco comment: smokes occasionally  Substance Use Topics  . Alcohol use: Yes    Alcohol/week: 0.0 oz    Family History  Problem Relation Age of Onset  . Diabetes Mother   . Diabetes Brother   . Diabetes Maternal Grandmother   . Cancer Maternal Grandmother   . Diabetes Maternal Grandfather   . Cancer Paternal Grandmother       Review of Systems  Constitutional: negative for fatigue and weight loss Respiratory: negative for cough and wheezing Cardiovascular: negative for chest pain, fatigue and palpitations Gastrointestinal: negative for abdominal pain and change in bowel habits Musculoskeletal:negative for myalgias Neurological: negative for gait problems and tremors Behavioral/Psych: negative for abusive relationship, depression Endocrine: negative for temperature intolerance    Genitourinary:negative for abnormal menstrual periods, genital lesions, hot flashes, sexual problems and vaginal discharge Integument/breast: negative for breast lump, breast tenderness, nipple  discharge and skin lesion(s)    Objective:       BP 119/71   Pulse 92   Ht 5\' 5"  (1.651 m)   Wt 208 lb (94.3 kg)   LMP 01/23/2018   BMI 34.61 kg/m  General:   alert  Skin:   no rash or abnormalities   Lungs:   clear to auscultation bilaterally  Heart:   regular rate and rhythm, S1, S2 normal, no murmur, click, rub or gallop  Breasts:   normal without suspicious masses, skin or nipple changes or axillary nodes  Abdomen:  normal findings: no organomegaly, soft, non-tender and no hernia obtunded  Pelvis:  External genitalia: normal general appearance Urinary system: urethral meatus normal and bladder without fullness, nontender Vaginal: normal without tenderness, induration or masses Cervix: normal appearance, slight prolapse  Grade 1, anterior Adnexa: normal bimanual exam Uterus: anteverted and non-tender, normal size   Lab Review Urine pregnancy test Labs reviewed yes Radiologic studies reviewed no  50% of 45 min visit spent on counseling and coordination of care.   Assessment & Plan    Healthy female exam.    1. Well woman exam    - Cytology - PAP - Prenatal Vit-Iron Carbonyl-FA (PRENATAL PLUS IRON) 29-1 MG TABS; Take 1 tablet by mouth daily.  Dispense: 30 tablet; Refill: 12 - Vitamin D (25 hydroxy) - CBC  2. Vaginal discharge      Hx of BV - Cervicovaginal ancillary only  3. Screening examination for STD (sexually transmitted disease)    - Cervicovaginal ancillary only - HIV antibody - Hepatitis B surface antigen - RPR - Hepatitis C antibody  4. Morbid obesity (HCC)    - Hemoglobin A1c  5. Abnormal uterine bleeding (AUB)      - CBC - TSH - Levonorgest-Eth Estrad-Fe Bisg (BALCOLTRA) 0.1-20 MG-MCG(21) TABS; Take 1 tablet by mouth daily.  Dispense: 28 tablet; Refill: 12  6. Dysmenorrhea    - Levonorgest-Eth Estrad-Fe Bisg (BALCOLTRA) 0.1-20 MG-MCG(21) TABS; Take 1 tablet by mouth daily.  Dispense: 28 tablet; Refill: 12  7. Encounter for initial prescription of contraceptive pills    - Levonorgest-Eth Estrad-Fe Bisg (BALCOLTRA) 0.1-20 MG-MCG(21) TABS; Take 1 tablet by mouth daily.  Dispense: 28 tablet; Refill: 12   Education reviewed: calcium supplements,  depression evaluation, low fat, low cholesterol diet, safe sex/STD prevention, self breast exams, skin cancer screening and weight bearing exercise. Contraception: OCP (estrogen/progesterone). Follow up in: 3 months. Contraception management.    Meds ordered this encounter  Medications  . Prenatal Vit-Iron Carbonyl-FA (PRENATAL PLUS IRON) 29-1 MG TABS    Sig: Take 1 tablet by mouth daily.    Dispense:  30 tablet    Refill:  12  . Levonorgest-Eth Estrad-Fe Bisg (BALCOLTRA) 0.1-20 MG-MCG(21) TABS    Sig: Take 1 tablet by mouth daily.    Dispense:  28 tablet    Refill:  12   Orders Placed This Encounter  Procedures  . HIV antibody  . Hepatitis B surface antigen  . RPR  . Hepatitis C antibody  . Hemoglobin A1c  . Vitamin D (25 hydroxy)  . CBC  . TSH   Need to obtain previous records Possible management options include:LoLo, TUVS

## 2018-02-04 ENCOUNTER — Other Ambulatory Visit: Payer: Self-pay | Admitting: Certified Nurse Midwife

## 2018-02-04 DIAGNOSIS — R7309 Other abnormal glucose: Secondary | ICD-10-CM | POA: Insufficient documentation

## 2018-02-04 DIAGNOSIS — E559 Vitamin D deficiency, unspecified: Secondary | ICD-10-CM | POA: Insufficient documentation

## 2018-02-04 LAB — CBC
HEMATOCRIT: 36.4 % (ref 34.0–46.6)
HEMOGLOBIN: 11.4 g/dL (ref 11.1–15.9)
MCH: 25.6 pg — AB (ref 26.6–33.0)
MCHC: 31.3 g/dL — ABNORMAL LOW (ref 31.5–35.7)
MCV: 82 fL (ref 79–97)
Platelets: 333 10*3/uL (ref 150–379)
RBC: 4.46 x10E6/uL (ref 3.77–5.28)
RDW: 13.9 % (ref 12.3–15.4)
WBC: 6.2 10*3/uL (ref 3.4–10.8)

## 2018-02-04 LAB — CERVICOVAGINAL ANCILLARY ONLY
Bacterial vaginitis: NEGATIVE
CHLAMYDIA, DNA PROBE: NEGATIVE
Candida vaginitis: NEGATIVE
NEISSERIA GONORRHEA: NEGATIVE
Trichomonas: NEGATIVE

## 2018-02-04 LAB — HEPATITIS C ANTIBODY: Hep C Virus Ab: <0.1 s/co ratio (ref 0.0–0.9)

## 2018-02-04 LAB — HEMOGLOBIN A1C
ESTIMATED AVERAGE GLUCOSE: 117 mg/dL
Hgb A1c MFr Bld: 5.7 % — ABNORMAL HIGH (ref 4.8–5.6)

## 2018-02-04 LAB — RPR: RPR: NONREACTIVE

## 2018-02-04 LAB — VITAMIN D 25 HYDROXY (VIT D DEFICIENCY, FRACTURES): Vit D, 25-Hydroxy: 11.1 ng/mL — ABNORMAL LOW (ref 30.0–100.0)

## 2018-02-04 LAB — HEPATITIS B SURFACE ANTIGEN: HEP B S AG: NEGATIVE

## 2018-02-04 LAB — TSH: TSH: 3.88 u[IU]/mL (ref 0.450–4.500)

## 2018-02-04 LAB — HIV ANTIBODY (ROUTINE TESTING W REFLEX): HIV SCREEN 4TH GENERATION: NONREACTIVE

## 2018-02-04 MED ORDER — VITAMIN D (ERGOCALCIFEROL) 1.25 MG (50000 UNIT) PO CAPS: 50000.0000 [IU] | ORAL_CAPSULE | ORAL | 2 refills | Status: DC

## 2018-02-11 LAB — CYTOLOGY - PAP
DIAGNOSIS: UNDETERMINED — AB
HPV 16/18/45 genotyping: NEGATIVE
HPV: DETECTED

## 2018-02-12 ENCOUNTER — Other Ambulatory Visit: Payer: Self-pay | Admitting: Certified Nurse Midwife

## 2018-02-12 DIAGNOSIS — R87610 Atypical squamous cells of undetermined significance on cytologic smear of cervix (ASC-US): Secondary | ICD-10-CM | POA: Insufficient documentation

## 2018-05-21 ENCOUNTER — Other Ambulatory Visit: Payer: Self-pay

## 2018-05-21 DIAGNOSIS — Z30011 Encounter for initial prescription of contraceptive pills: Secondary | ICD-10-CM

## 2018-05-21 DIAGNOSIS — N946 Dysmenorrhea, unspecified: Secondary | ICD-10-CM

## 2018-05-21 DIAGNOSIS — N939 Abnormal uterine and vaginal bleeding, unspecified: Secondary | ICD-10-CM

## 2018-05-21 MED ORDER — LEVONORGEST-ETH ESTRADIOL-IRON 0.1-20 MG-MCG(21) PO TABS: 1.0000 | ORAL_TABLET | Freq: Every day | ORAL | 3 refills | Status: DC

## 2018-05-21 NOTE — Progress Notes (Signed)
Pt needed pharmacy to be changed  [facility]  AEX was 01/2018

## 2018-07-17 ENCOUNTER — Emergency Department (HOSPITAL_BASED_OUTPATIENT_CLINIC_OR_DEPARTMENT_OTHER)
Admission: EM | Admit: 2018-07-17 | Discharge: 2018-07-17 | Disposition: A | Discharge status: Home / Self Care | Attending: Emergency Medicine | Admitting: Emergency Medicine

## 2018-07-17 ENCOUNTER — Other Ambulatory Visit: Payer: Self-pay

## 2018-07-17 ENCOUNTER — Encounter (HOSPITAL_BASED_OUTPATIENT_CLINIC_OR_DEPARTMENT_OTHER): Payer: Self-pay | Admitting: *Deleted

## 2018-07-17 DIAGNOSIS — F17210 Nicotine dependence, cigarettes, uncomplicated: Secondary | ICD-10-CM | POA: Insufficient documentation

## 2018-07-17 DIAGNOSIS — B9789 Other viral agents as the cause of diseases classified elsewhere: Secondary | ICD-10-CM

## 2018-07-17 DIAGNOSIS — J069 Acute upper respiratory infection, unspecified: Secondary | ICD-10-CM | POA: Diagnosis not present

## 2018-07-17 DIAGNOSIS — Z79899 Other long term (current) drug therapy: Secondary | ICD-10-CM | POA: Diagnosis not present

## 2018-07-17 DIAGNOSIS — R05 Cough: Secondary | ICD-10-CM | POA: Diagnosis present

## 2018-07-17 MED ORDER — GUAIFENESIN ER 1200 MG PO TB12: 1.0000 | ORAL_TABLET | Freq: Two times a day (BID) | ORAL | 0 refills | Status: DC | PRN

## 2018-07-17 MED ORDER — CETIRIZINE HCL 10 MG PO TABS: 10.0000 mg | ORAL_TABLET | Freq: Every day | ORAL | 0 refills | Status: DC

## 2018-07-17 MED ORDER — FLUTICASONE PROPIONATE 50 MCG/ACT NA SUSP: 1.0000 | Freq: Every day | NASAL | 0 refills | Status: DC

## 2018-07-17 MED ORDER — BENZONATATE 100 MG PO CAPS: 100.0000 mg | ORAL_CAPSULE | Freq: Three times a day (TID) | ORAL | 0 refills | Status: DC

## 2018-07-17 NOTE — ED Provider Notes (Signed)
[facility] EMERGENCY DEPARTMENT Provider Note   CSN: [id number] Arrival date & time: 07/17/18  1300     History   Chief Complaint Chief Complaint  Patient presents with  . Cough    HPI Holly Tran is a 22 y.o. female with no significant past medical history presents emergency department today for nasal congestion, rhinorrhea, itchy/watery eyes, scratchy throat, wheezing and dry cough.  Patient reports her symptoms began approximately 3 days ago.  She states this feels like her allergies but she did note that she had a mild temperature of 99 degrees yesterday.  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  She has been taking Tylenol, ibuprofen and Mucinex for symptoms.  No antibiotics prior to arrival.  Patient reports symptoms have not been worsening or improving.  She denies any neck stiffness, rash, chest pain, shortness of breath, hemoptysis, sore throat, inability to control secretions.  HPI  History reviewed. No pertinent past medical history.  Patient Active Problem List   Diagnosis Date Noted  . ASCUS of cervix with negative high risk HPV 02/12/2018  . Vitamin D deficiency 02/04/2018  . Elevated hemoglobin A1c 02/04/2018  . Dysmenorrhea 02/03/2018  . Abnormal uterine bleeding (AUB) 02/03/2018    History reviewed. No pertinent surgical history.   OB History    Gravida  0   Para  0   Term  0   Preterm  0   AB  0   Living  0     SAB  0   TAB  0   Ectopic  0   Multiple  0   Live Births               Home Medications    Prior to Admission medications   Medication Sig Start Date End Date Taking? Authorizing Provider  Levonorgest-Eth Estrad-Fe Bisg (BALCOLTRA) 0.1-20 MG-MCG(21) TABS Take 1 tablet by mouth daily. 05/21/18  Yes Brock Bad, MD  Hydrocortisone (GERHARDT'S BUTT CREAM) CREA Apply 1 application topically 2 (two) times daily. Patient not taking: Reported on 02/03/2018 01/02/15   Trena Platt  D, PA  naproxen (NAPROSYN) 250 MG tablet Take by mouth.    [provider]  Prenatal Vit-Iron Carbonyl-FA (PRENATAL PLUS IRON) 29-1 MG TABS Take 1 tablet by mouth daily. 02/03/18   Orvilla Cornwall A, CNM  Vitamin D, Ergocalciferol, (DRISDOL) 50000 units CAPS capsule Take 1 capsule (50,000 Units total) by mouth every 7 (seven) days. 02/04/18   Roe Coombs, CNM    Family History Family History  Problem Relation Age of Onset  . Diabetes Mother   . Diabetes Brother   . Diabetes Maternal Grandmother   . Breast cancer Maternal Grandmother   . Diabetes Maternal Grandfather   . Colon cancer Paternal Grandmother     Social History Social History   Tobacco Use  . Smoking status: Light Tobacco Smoker    Types: Cigarettes  . Smokeless tobacco: Never Used  . Tobacco comment: smokes occasionally  Substance Use Topics  . Alcohol use: Yes    Alcohol/week: 0.0 standard drinks  . Drug use: No     Allergies   Patient has no known allergies.   Review of Systems Review of Systems  All other systems reviewed and are negative.    Physical Exam Updated Vital Signs BP 135/75   Pulse (!) 106   Temp 99.5 F (37.5 C) (Oral)   Resp 18   Ht 5\' 5"  (1.651 m)  Wt 90.7 kg   SpO2 100%   BMI 33.28 kg/m   Physical Exam  Constitutional: She appears well-developed and well-nourished.  HENT:  Head: Normocephalic and atraumatic.  Right Ear: Tympanic membrane and external ear normal.  Left Ear: Tympanic membrane and external ear normal.  Nose: Mucosal edema and rhinorrhea present. Right sinus exhibits no maxillary sinus tenderness and no frontal sinus tenderness. Left sinus exhibits no maxillary sinus tenderness and no frontal sinus tenderness.  Mouth/Throat: Uvula is midline, oropharynx is clear and moist and mucous membranes are normal. No tonsillar exudate.  The patient has normal phonation and is in control of secretions. No stridor.  Midline uvula without edema. Soft palate  rises symmetrically.  No tonsillar erythema or exudates. No PTA. Tongue protrusion is normal. No trismus. No creptius on neck palpation and patient has good dentition. No gingival erythema or fluctuance noted. Mucus membranes moist.   Eyes: Pupils are equal, round, and reactive to light. Right eye exhibits no discharge. Left eye exhibits no discharge. No scleral icterus.  Neck: Trachea normal. Neck supple. No spinous process tenderness present. No neck rigidity. Normal range of motion present.  No nuchal rigidity or meningismus  Cardiovascular: Normal rate, regular rhythm and intact distal pulses.  No murmur heard. Pulses:      Radial pulses are 2+ on the right side, and 2+ on the left side.       Dorsalis pedis pulses are 2+ on the right side, and 2+ on the left side.       Posterior tibial pulses are 2+ on the right side, and 2+ on the left side.  No lower extremity swelling or edema. Calves symmetric in size bilaterally.  Pulmonary/Chest: Effort normal and breath sounds normal. She exhibits no tenderness.  Abdominal: Soft. Bowel sounds are normal. There is no tenderness. There is no rebound and no guarding.  Musculoskeletal: She exhibits no edema.  Lymphadenopathy:    She has no cervical adenopathy.  Neurological: She is alert.  Skin: Skin is warm and dry. No rash noted. She is not diaphoretic.  Psychiatric: She has a normal mood and affect.  Nursing note and vitals reviewed.    ED Treatments / Results  Labs (all labs ordered are listed, but only abnormal results are displayed) Labs Reviewed - No data to display  EKG None  Radiology No results found.  Procedures Procedures (including critical care time)  Medications Ordered in ED Medications - No data to display   Initial Impression / Assessment and Plan / ED Course  I have reviewed the triage vital signs and the nursing notes.  Pertinent labs & imaging results that were available during my care of the patient were  reviewed by me and considered in my medical decision making (see chart for details).     22 y.o. female who presents with nasal congestion, rhinorrhea, scratchy throat, itchy/watery eyes, sneezing and a dry cough.  Patient is without fever.  She denies any sore throat, or sputum production.  On exam patient is without tonsillar erythema, edema or exudates.  No concern for strep at this time.  No PTA or concern for RPA.  No meningeal signs.  No evidence of mastoiditis.  Lungs are clear to auscultation bilaterally.  Suspect viral URI versus allergic rhinitis.  Will discharge home on symptomatic therapy.  Recommended patient follow-up with PCP next week if symptoms are not improving.  Return precautions were discussed.  Patient appears safe for discharge.  Final Clinical Impressions(s) / ED  Diagnoses   Final diagnoses:  Viral URI with cough    ED Discharge Orders         Ordered    benzonatate (TESSALON) 100 MG capsule  Every 8 hours     07/17/18 1355    Guaifenesin (MUCINEX MAXIMUM STRENGTH) 1200 MG TB12  Every 12 hours PRN     07/17/18 1355    cetirizine (ZYRTEC) 10 MG tablet  Daily     07/17/18 1355    fluticasone (FLONASE) 50 MCG/ACT nasal spray  Daily     07/17/18 1355           Jacinto Halim, PA-C 07/17/18 1355    Virgina Norfolk, DO 07/17/18 1509

## 2018-07-17 NOTE — ED Triage Notes (Signed)
Cough, fever, runny nose, body aches x 3 days.

## 2018-07-17 NOTE — Discharge Instructions (Signed)
Please read and follow all provided instructions.  Your diagnoses today include:  1. Viral URI with cough     Tests performed today include: Vital signs. See below for your results today.   Medications prescribed/advised:  1. Mucinex [Guaifenesin] as a decongestant [thin mucus - you have to be well hydrated when taking this for it to work] - continue this at home 2. Tylenol for fever/pain and Motrin/Ibuprofen for muscle aches 3. Flonase Steroid Nasal Spray. This does not work to maximum capability unless used daily >1-2 weeks.  4. Allegra or Zyrtec: This medication is an allergy medication that may aid in helping to relieve your symptoms. Please take daily and discuss with your PCP if you should remain on this medication at follow up. If you were prescribed a allergy medication (allegra) please take daily.  5. Cough Suppressant: Tessalon - take as directed.   Home care instructions:  An upper respiratory infection (URI) is also sometimes known as the common cold. Most people improve within 1 week, but symptoms can last up to 2 weeks. A residual cough may last even longer.   URI is most commonly caused by a virus. Viruses are NOT treated with antibiotics. You can easily spread the virus to others by oral contact. This includes kissing, sharing a glass, coughing, or sneezing. Touching your mouth or nose and then touching a surface, which is then touched by another person, can also spread the virus.   TREATMENT  Treatment is directed at relieving symptoms. There is no cure. Antibiotics are not effective, because the infection is caused by a virus, not by bacteria. Treatment may include:  Increased fluid intake. Sports drinks offer valuable electrolytes, sugars, and fluids.  Breathing heated mist or steam (vaporizer or shower).  Eating chicken soup or other clear broths, and maintaining good nutrition.  Getting plenty of rest.  Using gargles or lozenges for comfort.  Controlling fevers with  ibuprofen or acetaminophen as directed by your caregiver.  Increasing usage of your inhaler if you have asthma.  Return to work when your temperature has returned to normal.   Follow-up instructions: Followup with your primary care doctor in 4 days if your symptoms persist.  Your more than welcome to return to the emergency department if symptoms worsen or become concerning.  Return instructions:  Please return to the Emergency Department if you do not get better, if you get worse, or new symptoms OR  - Fever (temperature greater than 101.15F)  - Bleeding that does not stop with holding pressure to the area    -Severe pain (please note that you may be more sore the day after your accident)  - Chest Pain  - Difficulty breathing (worsening shortness of breath with sputum production may be a sign of pneumonia.   - Severe nausea or vomiting  - Inability to tolerate food and liquids  - Passing out  - Skin becoming red around your wounds  - Change in mental status (confusion or lethargy)  - New numbness or weakness     -You develop fever, swollen neck glands, pain with swallowing or white areas on  the back of your throat. This may be a sign of strep throat.  Please return if you have any other emergent concerns.  Additional Information:  Your vital signs today were: BP 135/75    Pulse (!) 106    Temp 99.5 F (37.5 C) (Oral)    Resp 18    Ht 5\' 5"  (1.651 m)  Wt 90.7 kg    SpO2 100%    BMI 33.28 kg/m  If your blood pressure (BP) was elevated above 135/85 this visit, please have this repeated by your doctor within one month.

## 2019-01-07 ENCOUNTER — Telehealth: Payer: Self-pay

## 2019-01-07 NOTE — Telephone Encounter (Signed)
Patient called stating she has an itchy rash on both breasts, has been going on for a couple weeks with no changes. I advised pt to try hydrocortisone and benadryl cream to see it if clears it up. Advised pt that if it does not clear up in the next couple days to call back and she can come in to be evaluated. Pt verbalized understanding.

## 2019-01-22 IMAGING — DX DG NECK SOFT TISSUE
2 series · 2 of 2 positions shown · non-contrast
Comparison: No prior.

CLINICAL DATA: Possible fishbone stuck in throat.  Pain.

EXAM:
NECK SOFT TISSUES - 1+ VIEW

[neck lat]
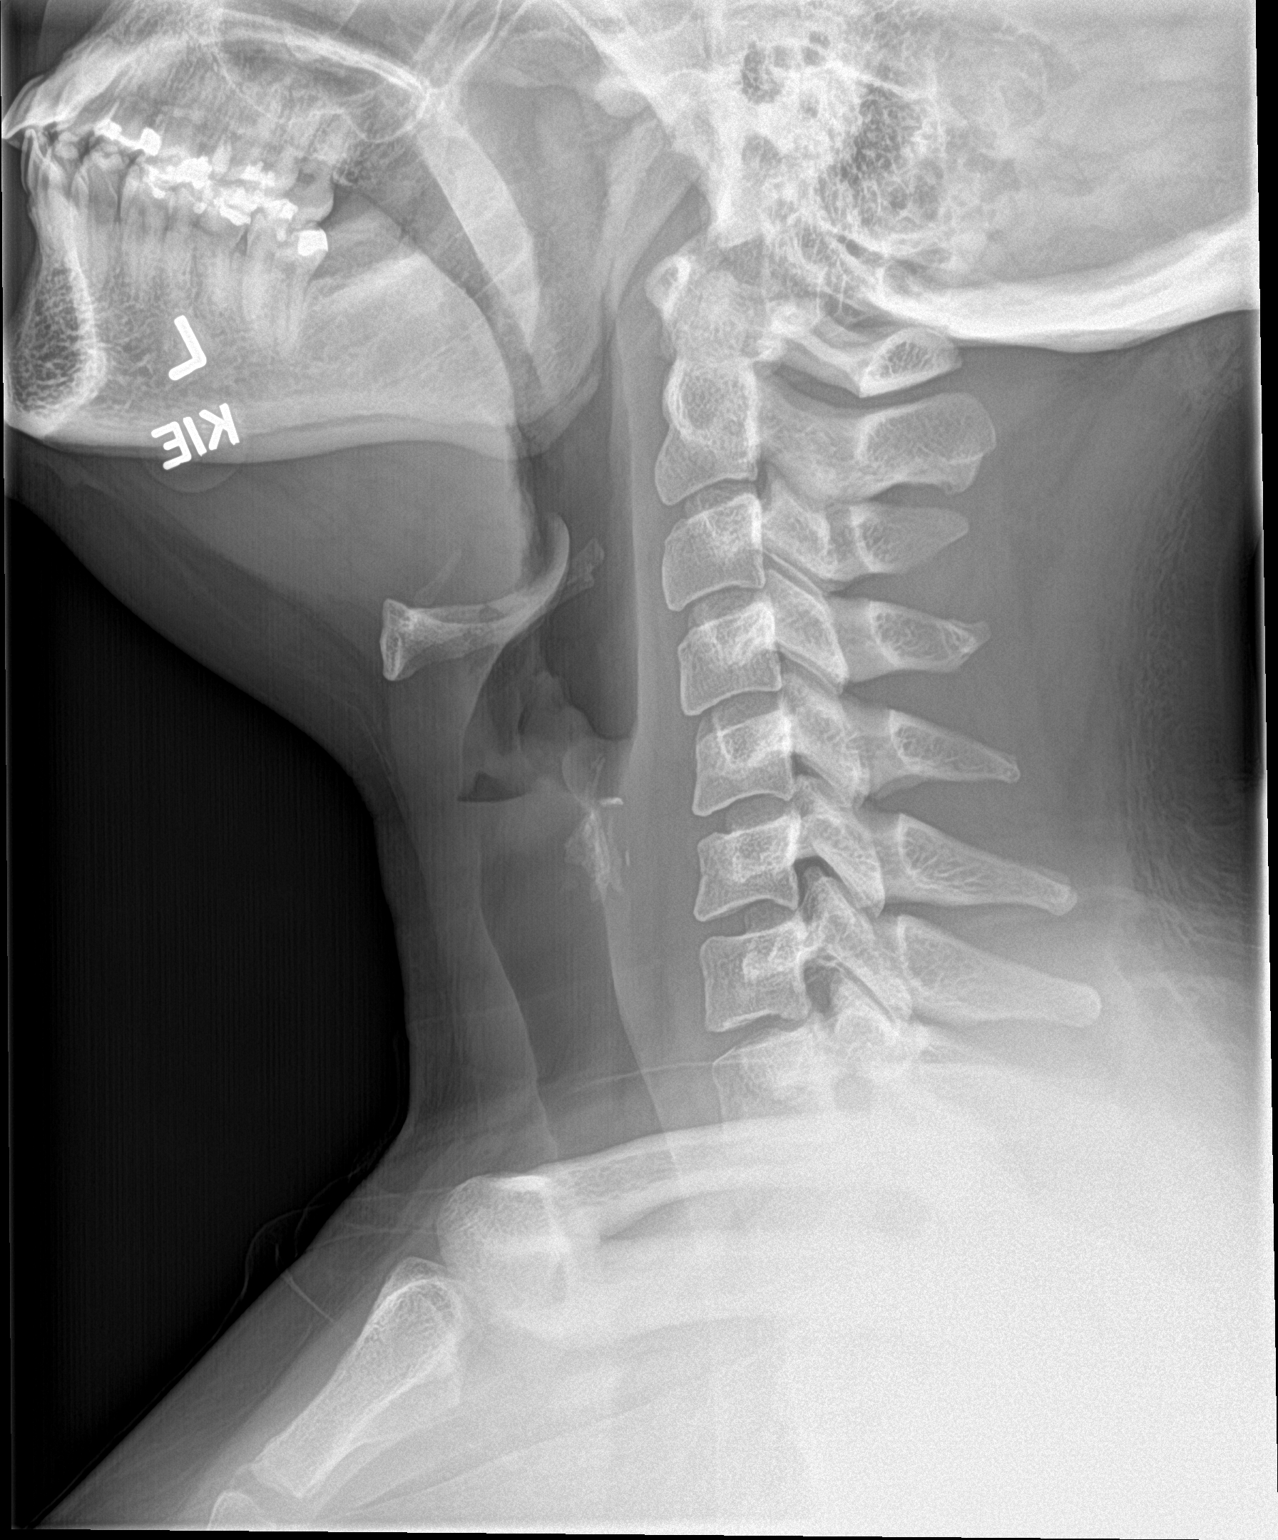

[neck ap]
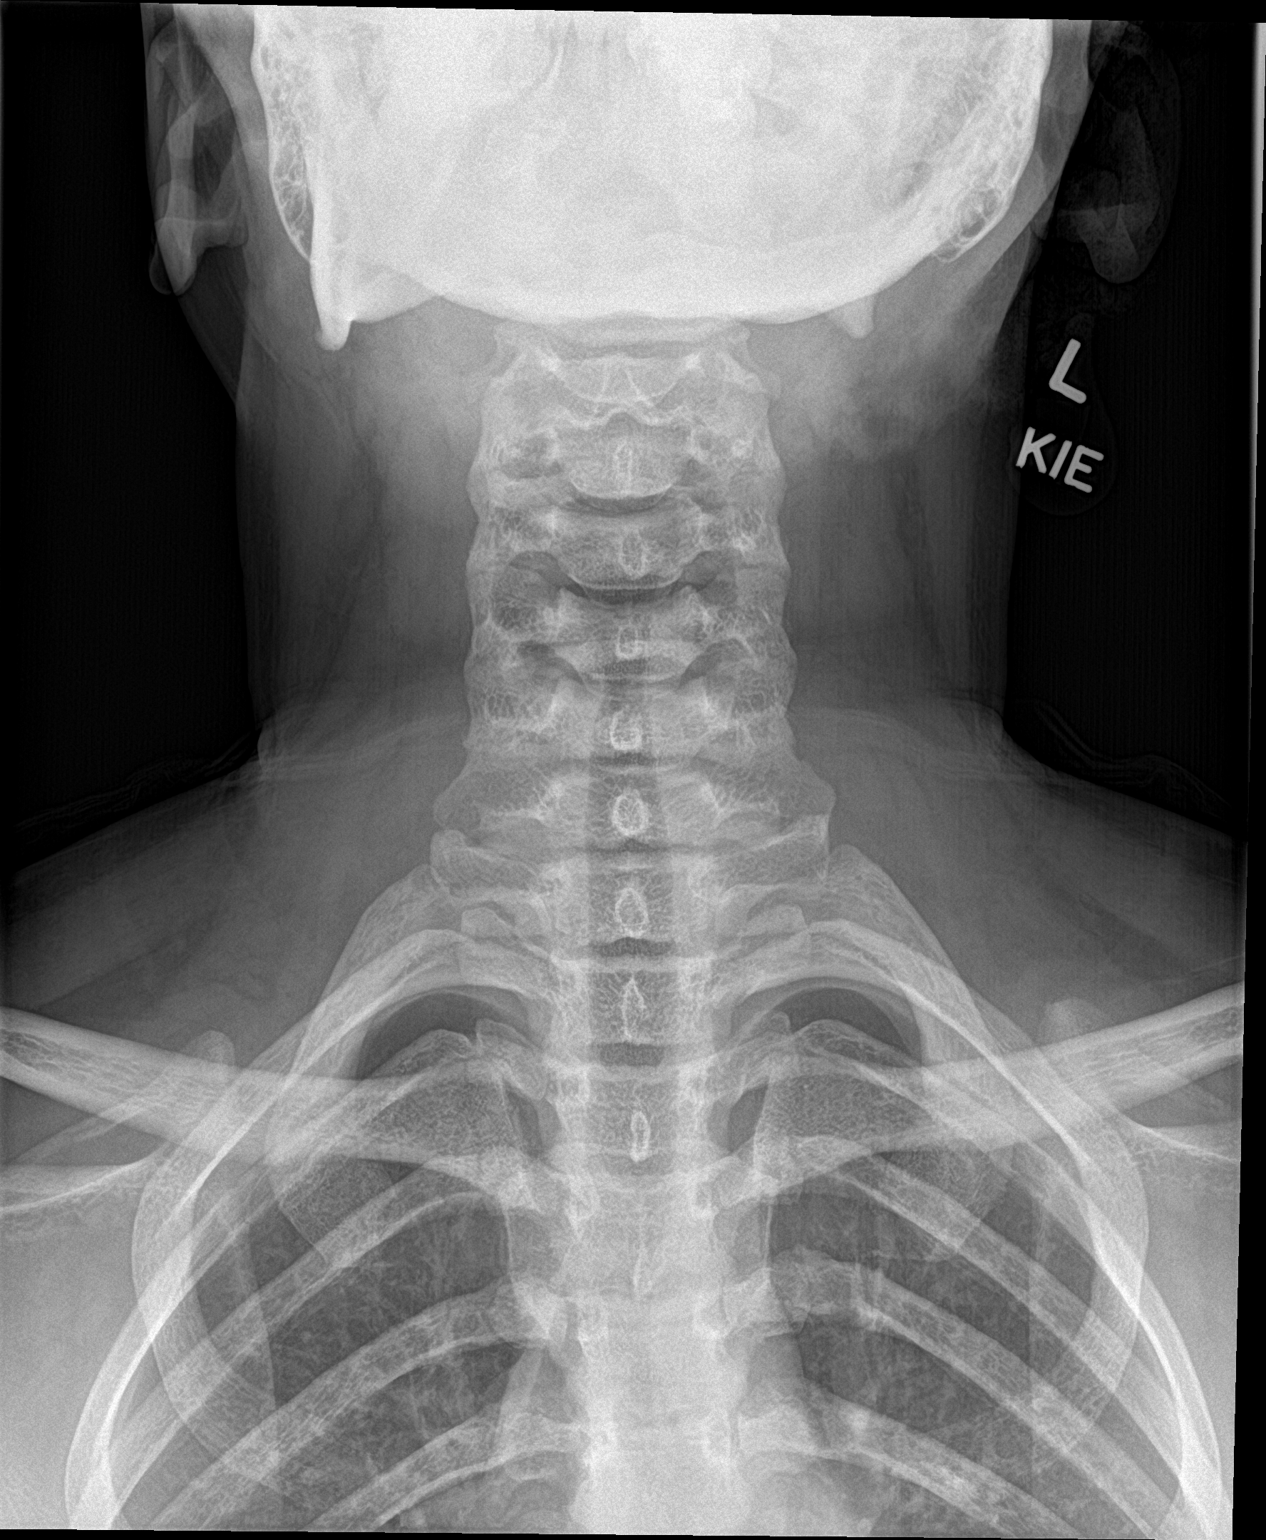

[2 of 2 positions shown; findings below may reference images not displayed]

FINDINGS: Calcific densities noted along the posterior larynx are most likely
normal range of calcifications. No other focal abnormality
identified. No soft tissue swelling. No acute bony abnormality. If
symptoms persist CT can be obtained.
IMPRESSION: No acute abnormality identified.

## 2019-01-25 ENCOUNTER — Ambulatory Visit: Admitting: Obstetrics and Gynecology

## 2019-02-08 ENCOUNTER — Ambulatory Visit: Admitting: Advanced Practice Midwife

## 2019-03-24 ENCOUNTER — Ambulatory Visit: Payer: TRICARE For Life (TFL) | Admitting: Obstetrics and Gynecology

## 2019-04-20 ENCOUNTER — Ambulatory Visit: Payer: TRICARE For Life (TFL) | Admitting: Obstetrics and Gynecology

## 2019-04-29 ENCOUNTER — Ambulatory Visit (INDEPENDENT_AMBULATORY_CARE_PROVIDER_SITE_OTHER): Payer: PRIVATE HEALTH INSURANCE | Admitting: Certified Nurse Midwife

## 2019-04-29 ENCOUNTER — Other Ambulatory Visit: Payer: Self-pay

## 2019-04-29 ENCOUNTER — Encounter: Payer: Self-pay | Admitting: Certified Nurse Midwife

## 2019-04-29 VITALS — BP 121/76 | HR 95 | Ht 65.0 in | Wt 201.1 lb

## 2019-04-29 DIAGNOSIS — Z01419 Encounter for gynecological examination (general) (routine) without abnormal findings: Secondary | ICD-10-CM

## 2019-04-29 DIAGNOSIS — N898 Other specified noninflammatory disorders of vagina: Secondary | ICD-10-CM | POA: Diagnosis not present

## 2019-04-29 DIAGNOSIS — Z1151 Encounter for screening for human papillomavirus (HPV): Secondary | ICD-10-CM | POA: Diagnosis not present

## 2019-04-29 DIAGNOSIS — Z124 Encounter for screening for malignant neoplasm of cervix: Secondary | ICD-10-CM

## 2019-04-29 DIAGNOSIS — Z113 Encounter for screening for infections with a predominantly sexual mode of transmission: Secondary | ICD-10-CM | POA: Diagnosis not present

## 2019-04-29 NOTE — Patient Instructions (Signed)
Colposcopy Colposcopy is a procedure to examine the lowest part of the uterus (cervix), the vagina, and the area around the vaginal opening (vulva) for abnormalities or signs of disease. The procedure is done using a lighted microscope or magnifying lens (colposcope). If any unusual cells are found during the procedure, your health care provider may remove a tissue sample for testing (biopsy). A colposcopy may be done if you:  Have an abnormal Pap test. A Pap test is a screening test that is used to check for signs of cancer or infection of the vagina, cervix, and uterus.  Have a Pap smear test in which you test positive for high-risk HPV (human papillomavirus).  Have a sore or lesion on your cervix.  Have genital warts on your vulva, vagina, or cervix.  Took certain medicines while pregnant, such as diethylstilbestrol (DES).  Have pain during sexual intercourse.  Have vaginal bleeding, especially after sexual intercourse.  Need to have a cervical polyp removed.  Need to have a lost intrauterine device (IUD) string located. Let your health care provider know about:  Any allergies you have, including allergies to prescribed medicine, latex, or iodine.  All medicines you are taking, including vitamins, herbs, eye drops, creams, and over-the-counter medicines. Bring a list of all of your medicines to your appointment.  Any problems you or family members have had with anesthetic medicines.  Any blood disorders you have.  Any surgeries you have had.  Any medical conditions you have, such as pelvic inflammatory disease (PID) or endometrial disorder.  Any history of frequent fainting.  Your menstrual cycle and what form of birth control (contraception) you use.  Your medical history, including any prior cervical treatment.  Whether you are pregnant or may be pregnant. What are the risks? Generally, this is a safe procedure. However, problems may occur, including:  Pain.   Infection, which may include a fever, bad-smelling discharge, or pelvic pain.  Bleeding or discharge.  Misdiagnosis.  Fainting and vasovagal reactions, but this is rare.  Allergic reactions to medicines.  Damage to other structures or organs. What happens before the procedure?  If you have your menstrual period or will have it at the time of your procedure, tell your health care provider. A colposcopy typically is not done during menstruation.  Continue your contraceptive practices before and after the procedure.  For 24 hours before the colposcopy: ? Do not douche. ? Do not use tampons. ? Do not use medicines, creams, or suppositories in the vagina. ? Do not have sexual intercourse.  Ask your health care provider about: ? Changing or stopping your regular medicines. This is especially important if you are taking diabetes medicines or blood thinners. ? Taking medicines such as aspirin and ibuprofen. These medicines can thin your blood. Do not take these medicines before your procedure if your health care provider instructs you not to. It is likely that your health care provider will tell you to avoid taking aspirin or medicine that contains aspirin for 7 days before the procedure.  Follow instructions from your health care provider about eating or drinking restrictions. You will likely need to eat a regular diet the day of the procedure and not skip any meals.  You may have an exam or testing. A pregnancy test will be taken on the day of the procedure.  You may have a blood or urine sample taken.  Plan to have someone take you home from the hospital or clinic.  If you will be going   home right after the procedure, plan to have someone with you for 24 hours. What happens during the procedure?  You will lie down on your back, with your feet in foot rests (stirrups).  A warmed and lubricated instrument (speculum) will be inserted into your vagina. The speculum will be used to hold  apart the walls of your vagina so your health care provider can see your cervix and the inside of your vagina.  A cotton swab will be used to place a small amount of liquid solution on the areas to be examined. This solution makes it easier to see abnormal cells. You may feel a slight burning during this part.  The colposcope will be used to scan the cervix with a bright white light. The colposcope will be held near your vulvaand will magnify your vulva, vagina, and cervix for easier examination.  Your health care provider may decide to take a biopsy. If so: ? You may be given medicine to numb the area (local anesthetic). ? Surgical instruments will be used to suck out mucus and cells through your vagina. ? You may feel mild pain while the tissue sample is removed. ? Bleeding may occur. A solution may be used to stop the bleeding. ? If a sample of tissue is needed from the inside of the cervix, a different procedure called endocervical curettage (ECC) may be completed. During this procedure, a curved instrument (curette) will be used to scrape cells from your cervix or the top of your cervix (endocervix).  Your health care provider will record the location of any abnormalities. The procedure may vary among health care providers and hospitals. What happens after the procedure?  You will lie down and rest for a few minutes. You may be offered juice or cookies.  Your blood pressure, heart rate, breathing rate, and blood oxygen level will be monitored until any medicines you were given have worn off.  You may have to wear compression stockings. These stockings help to prevent blood clots and reduce swelling in your legs.  You may have some cramping in your abdomen. This should go away after a few minutes. This information is not intended to replace advice given to you by your health care provider. Make sure you discuss any questions you have with your health care provider. Document Released:  01/04/2003 Document Revised: 09/26/2017 Document Reviewed: 05/20/2016 Elsevier Patient Education  2020 Elsevier Inc.  

## 2019-04-29 NOTE — Progress Notes (Signed)
GYNECOLOGY ANNUAL PREVENTATIVE CARE ENCOUNTER NOTE  History:     Holly Tran is a 23 y.o. G0P0000 female here for a routine annual gynecologic exam.  Current complaints: none.   Denies abnormal vaginal bleeding, discharge, pelvic pain, problems with intercourse or other gynecologic concerns. Requesting full STD screening, currently not on [facility]- stopped in March and does not want to be on anything for contraception    Gynecologic History Patient's last menstrual period was 04/24/2019. Contraception: none Last Pap: 01/2018. Results were: abnormal with positive HPV  Obstetric History OB History  Gravida Para Term Preterm AB Living  0 0 0 0 0 0  SAB TAB Ectopic Multiple Live Births  0 0 0 0      History reviewed. No pertinent past medical history.  Past Surgical History:  Procedure Laterality Date  . WISDOM TOOTH EXTRACTION      Current Outpatient Medications on File Prior to Visit  Medication Sig Dispense Refill  . benzonatate (TESSALON) 100 MG capsule Take 1 capsule (100 mg total) by mouth every 8 (eight) hours. (Patient not taking: Reported on 04/29/2019) 21 capsule 0  . cetirizine (ZYRTEC) 10 MG tablet Take 1 tablet (10 mg total) by mouth daily. (Patient not taking: Reported on 04/29/2019) 30 tablet 0  . fluticasone (FLONASE) 50 MCG/ACT nasal spray Place 1 spray into both nostrils daily. (Patient not taking: Reported on 04/29/2019) 16 g 0  . Guaifenesin (MUCINEX MAXIMUM STRENGTH) 1200 MG TB12 Take 1 tablet (1,200 mg total) by mouth every 12 (twelve) hours as needed. (Patient not taking: Reported on 04/29/2019) 14 tablet 0  . Hydrocortisone (GERHARDT'S BUTT CREAM) CREA Apply 1 application topically 2 (two) times daily. (Patient not taking: Reported on 02/03/2018) 1 each 0  . Levonorgest-Eth Estrad-Fe Bisg (BALCOLTRA) 0.1-20 MG-MCG(21) TABS Take 1 tablet by mouth daily. (Patient not taking: Reported on 04/29/2019) 84 tablet 3  . naproxen (NAPROSYN) 250 MG tablet Take by mouth.    . Prenatal  Vit-Iron Carbonyl-FA (PRENATAL PLUS IRON) 29-1 MG TABS Take 1 tablet by mouth daily. (Patient not taking: Reported on 04/29/2019) 30 tablet 12  . Vitamin D, Ergocalciferol, (DRISDOL) 50000 units CAPS capsule Take 1 capsule (50,000 Units total) by mouth every 7 (seven) days. (Patient not taking: Reported on 04/29/2019) 30 capsule 2   No current facility-administered medications on file prior to visit.     No Known Allergies  Social History:  reports that she has quit smoking. Her smoking use included cigarettes. She has never used smokeless tobacco. She reports current alcohol use. She reports that she does not use drugs.  Family History  Problem Relation Age of Onset  . Diabetes Mother   . Diabetes Brother   . Diabetes Maternal Grandmother   . Breast cancer Maternal Grandmother   . Diabetes Maternal Grandfather   . Colon cancer Paternal Grandmother     The following portions of the patient's history were reviewed and updated as appropriate: allergies, current medications, past family history, past medical history, past social history, past surgical history and problem list.  Review of Systems Pertinent items noted in HPI and remainder of comprehensive ROS otherwise negative.  Physical Exam:  BP 121/76   Pulse 95   Ht 5\' 5"  (1.651 m)   Wt 201 lb 1.6 oz (91.2 kg)   LMP 04/24/2019   BMI 33.46 kg/m  CONSTITUTIONAL: Well-developed, well-nourished female in no acute distress.  HENT:  Normocephalic, atraumatic, External right and left ear normal. Oropharynx is clear and moist  EYES: Conjunctivae and EOM are normal. Pupils are equal, round, and reactive to light.  NECK: Normal range of motion, supple, no masses.  Normal thyroid.  SKIN: Skin is warm and dry. No rash noted. Not diaphoretic. No erythema. No pallor. MUSCULOSKELETAL: Normal range of motion. No tenderness.  No cyanosis, clubbing, or edema.  2+ distal pulses. NEUROLOGIC: Alert and oriented to person, place, and time. Normal  reflexes, muscle tone coordination. No cranial nerve deficit noted. PSYCHIATRIC: Normal mood and affect. Normal behavior. Normal judgment and thought content. CARDIOVASCULAR: Normal heart rate noted, regular rhythm RESPIRATORY: Clear to auscultation bilaterally. Effort and breath sounds normal, no problems with respiration noted. BREASTS: Symmetric in size. No masses, skin changes, nipple drainage, or lymphadenopathy. ABDOMEN: Soft, normal bowel sounds, no distention noted.  No tenderness, rebound or guarding.  PELVIC: Normal appearing external genitalia; normal appearing vaginal mucosa and cervix.  No abnormal discharge noted.  Pap smear obtained.  Normal uterine size, no other palpable masses, no uterine or adnexal tenderness.   Assessment and Plan:    1. Women's annual routine gynecological examination - Normal well woman examination  - Family hx of diabetes, A1C ordered  - Cytology - PAP( [place]) - HgB A1c  2. Pap smear for cervical cancer screening - Cytology - PAP( [place])  3. Screening for STDs (sexually transmitted diseases) - Patient request full STD screening, currently not having intercourse have not since March  - Cervicovaginal ancillary only( [place]) - HIV antibody (with reflex) - RPR - Hepatitis B Surface AntiGEN - Hepatitis C Antibody  Will follow up results of pap smear and manage accordingly. Routine preventative health maintenance measures emphasized. Please refer to After Visit Summary for other counseling recommendations.      Holly Tran, CNM [facility], [facility]

## 2019-04-29 NOTE — Progress Notes (Signed)
Pt is in the office for annual, last pap 02-03-2018. GAD7 score=0 Pt desires full std panel, declines BC.

## 2019-05-01 LAB — RPR: RPR Ser Ql: NONREACTIVE

## 2019-05-01 LAB — HEPATITIS C ANTIBODY: Hep C Virus Ab: <0.1 s/co ratio (ref 0.0–0.9)

## 2019-05-01 LAB — HEMOGLOBIN A1C
Est. average glucose Bld gHb Est-mCnc: 111 mg/dL
Hgb A1c MFr Bld: 5.5 % (ref 4.8–5.6)

## 2019-05-01 LAB — HIV ANTIBODY (ROUTINE TESTING W REFLEX): HIV Screen 4th Generation wRfx: NONREACTIVE

## 2019-05-01 LAB — HEPATITIS B SURFACE ANTIGEN: Hepatitis B Surface Ag: NEGATIVE

## 2019-05-03 LAB — CERVICOVAGINAL ANCILLARY ONLY
Bacterial vaginitis: NEGATIVE
Candida vaginitis: NEGATIVE
Chlamydia: NEGATIVE
Neisseria Gonorrhea: NEGATIVE
Trichomonas: NEGATIVE

## 2019-05-06 LAB — CYTOLOGY - PAP
Diagnosis: NEGATIVE
HPV: NOT DETECTED

## 2019-06-28 ENCOUNTER — Encounter

## 2019-11-29 ENCOUNTER — Other Ambulatory Visit: Payer: Self-pay | Admitting: *Deleted

## 2019-11-29 DIAGNOSIS — Z20822 Contact with and (suspected) exposure to covid-19: Secondary | ICD-10-CM

## 2019-11-30 LAB — NOVEL CORONAVIRUS, NAA: SARS-CoV-2, NAA: NOT DETECTED

## 2020-05-22 ENCOUNTER — Ambulatory Visit: Payer: PRIVATE HEALTH INSURANCE | Attending: Internal Medicine

## 2020-05-22 DIAGNOSIS — Z20822 Contact with and (suspected) exposure to covid-19: Secondary | ICD-10-CM

## 2020-05-23 LAB — SARS-COV-2, NAA 2 DAY TAT

## 2020-05-23 LAB — NOVEL CORONAVIRUS, NAA: SARS-CoV-2, NAA: NOT DETECTED

## 2020-09-13 ENCOUNTER — Encounter: Payer: Self-pay | Admitting: Certified Nurse Midwife

## 2020-09-13 ENCOUNTER — Other Ambulatory Visit: Payer: Self-pay

## 2020-09-13 ENCOUNTER — Other Ambulatory Visit (HOSPITAL_COMMUNITY)
Admission: RE | Admit: 2020-09-13 | Discharge: 2020-09-13 | Disposition: A | Discharge status: Home / Self Care | Payer: No Typology Code available for payment source | Source: Ambulatory Visit | Attending: Certified Nurse Midwife | Admitting: Certified Nurse Midwife

## 2020-09-13 ENCOUNTER — Ambulatory Visit (INDEPENDENT_AMBULATORY_CARE_PROVIDER_SITE_OTHER): Payer: No Typology Code available for payment source | Admitting: Certified Nurse Midwife

## 2020-09-13 VITALS — BP 119/79 | HR 68 | Ht 65.0 in | Wt 205.0 lb

## 2020-09-13 DIAGNOSIS — Z01419 Encounter for gynecological examination (general) (routine) without abnormal findings: Secondary | ICD-10-CM | POA: Diagnosis not present

## 2020-09-13 NOTE — Progress Notes (Signed)
GYNECOLOGY ANNUAL PREVENTATIVE CARE ENCOUNTER NOTE  History:     Holly Tran is a 24 y.o. G0P0000 female here for a routine annual gynecologic exam.  Current complaints: none.   Denies abnormal vaginal bleeding, discharge, pelvic pain, problems with intercourse or other gynecologic concerns. Patient request pap smear today.    Gynecologic History Patient's last menstrual period was 08/26/2020. Contraception: abstinence Last Pap: 04/2019. Results were: normal with negative HPV  Obstetric History OB History  Gravida Para Term Preterm AB Living  0 0 0 0 0 0  SAB TAB Ectopic Multiple Live Births  0 0 0 0      History reviewed. No pertinent past medical history.  Past Surgical History:  Procedure Laterality Date  . WISDOM TOOTH EXTRACTION      Current Outpatient Medications on File Prior to Visit  Medication Sig Dispense Refill  . Prenatal Vit-Iron Carbonyl-FA (PRENATAL PLUS IRON) 29-1 MG TABS Take 1 tablet by mouth daily. (Patient not taking: Reported on 04/29/2019) 30 tablet 12   No current facility-administered medications on file prior to visit.    No Known Allergies  Social History:  reports that she has quit smoking. Her smoking use included cigarettes. She has never used smokeless tobacco. She reports previous alcohol use. She reports that she does not use drugs.  Family History  Problem Relation Age of Onset  . Diabetes Mother   . Diabetes Brother   . Diabetes Maternal Grandmother   . Breast cancer Maternal Grandmother   . Diabetes Maternal Grandfather   . Colon cancer Paternal Grandmother     The following portions of the patient's history were reviewed and updated as appropriate: allergies, current medications, past family history, past medical history, past social history, past surgical history and problem list.  Review of Systems Pertinent items noted in HPI and remainder of comprehensive ROS otherwise negative.  Physical Exam:  BP 119/79   Pulse 68    Ht 5\' 5"  (1.651 m)   Wt 205 lb (93 kg)   LMP 08/26/2020   BMI 34.11 kg/m  CONSTITUTIONAL: Well-developed, well-nourished female in no acute distress.  HENT:  Normocephalic, atraumatic, External right and left ear normal. Oropharynx is clear and moist EYES: Conjunctivae and EOM are normal. Pupils are equal, round, and reactive to light. NECK: Normal range of motion, supple, no masses.  Normal thyroid.  SKIN: Skin is warm and dry. No rash noted. Not diaphoretic. No erythema. No pallor. MUSCULOSKELETAL: Normal range of motion. No tenderness.  No cyanosis, clubbing, or edema.  2+ distal pulses. NEUROLOGIC: Alert and oriented to person, place, and time. Normal reflexes, muscle tone coordination.  PSYCHIATRIC: Normal mood and affect. Normal behavior. Normal judgment and thought content. CARDIOVASCULAR: Normal heart rate noted, regular rhythm RESPIRATORY: Clear to auscultation bilaterally. Effort and breath sounds normal, no problems with respiration noted. BREASTS: Symmetric in size. No masses, tenderness, skin changes, nipple drainage, or lymphadenopathy bilaterally. Performed in the presence of a chaperone. ABDOMEN: Soft, no distention noted.  No tenderness, rebound or guarding.  PELVIC: Normal appearing external genitalia and urethral meatus; normal appearing vaginal mucosa and cervix.  No abnormal discharge noted.  Pap smear obtained.  Normal uterine size, no other palpable masses, no uterine or adnexal tenderness.  Performed in the presence of a chaperone.   Assessment and Plan:    1. Women's annual routine gynecological examination - normal well woman examination  - evaluated for diabetes and thyroid disease completed today  - Cytology - PAP( [facility]) - HgB A1c - CBC - TSH  Will follow up results of pap smear and manage accordingly. Routine preventative health maintenance measures emphasized. Please refer to After Visit Summary for other counseling recommendations.      Sharyon Cable, CNM [facility] [employer], [facility]

## 2020-09-13 NOTE — Progress Notes (Signed)
Pt has no complaints today.  PHQ-2 score 0.

## 2020-09-14 LAB — CBC
Hematocrit: 34.9 % (ref 34.0–46.6)
Hemoglobin: 10.6 g/dL — ABNORMAL LOW (ref 11.1–15.9)
MCH: 23.6 pg — ABNORMAL LOW (ref 26.6–33.0)
MCHC: 30.4 g/dL — ABNORMAL LOW (ref 31.5–35.7)
MCV: 78 fL — ABNORMAL LOW (ref 79–97)
Platelets: 363 10*3/uL (ref 150–450)
RBC: 4.49 x10E6/uL (ref 3.77–5.28)
RDW: 13.5 % (ref 11.7–15.4)
WBC: 6.3 10*3/uL (ref 3.4–10.8)

## 2020-09-14 LAB — TSH: TSH: 1.44 u[IU]/mL (ref 0.450–4.500)

## 2020-09-14 LAB — HEMOGLOBIN A1C
Est. average glucose Bld gHb Est-mCnc: 120 mg/dL
Hgb A1c MFr Bld: 5.8 % — ABNORMAL HIGH (ref 4.8–5.6)

## 2020-09-15 LAB — CYTOLOGY - PAP: Diagnosis: NEGATIVE

## 2021-03-05 ENCOUNTER — Other Ambulatory Visit: Payer: Self-pay

## 2021-03-05 ENCOUNTER — Encounter: Payer: Self-pay | Admitting: *Deleted

## 2021-03-05 ENCOUNTER — Ambulatory Visit
Admission: EM | Admit: 2021-03-05 | Discharge: 2021-03-05 | Disposition: A | Discharge status: Home / Self Care | Payer: No Typology Code available for payment source | Attending: Emergency Medicine | Admitting: Emergency Medicine

## 2021-03-05 DIAGNOSIS — Z20822 Contact with and (suspected) exposure to covid-19: Secondary | ICD-10-CM | POA: Diagnosis not present

## 2021-03-05 MED ORDER — ACETAMINOPHEN 325 MG PO TABS
650.0000 mg | ORAL_TABLET | Freq: Once | ORAL | Status: AC
  Administered 2021-03-05: 650 mg via ORAL

## 2021-03-05 MED ORDER — IBUPROFEN 800 MG PO TABS: 800.0000 mg | ORAL_TABLET | Freq: Three times a day (TID) | ORAL | 0 refills | Status: AC

## 2021-03-05 MED ORDER — BENZONATATE 200 MG PO CAPS
200.0000 mg | ORAL_CAPSULE | Freq: Three times a day (TID) | ORAL | 0 refills | Status: AC | PRN
Start: 2021-03-05 — End: 2021-03-12

## 2021-03-05 NOTE — ED Provider Notes (Signed)
[facility]    CSN: [id number] Arrival date & time: 03/05/21  1696      History   Chief Complaint Chief Complaint  Patient presents with  . Chills  . Fever    HPI Holly Tran is a 25 y.o. female presenting today for evaluation of URI symptoms.  Reports associated sore throat, fever fatigue and headaches.  Symptoms began yesterday, fever last night up to 101.3.  At home COVID test positive.  Using Mucinex DM.  HPI  History reviewed. No pertinent past medical history.  Patient Active Problem List   Diagnosis Date Noted  . ASCUS of cervix with negative high risk HPV 02/12/2018  . Vitamin D deficiency 02/04/2018  . Elevated hemoglobin A1c 02/04/2018  . Dysmenorrhea 02/03/2018  . Abnormal uterine bleeding (AUB) 02/03/2018    Past Surgical History:  Procedure Laterality Date  . WISDOM TOOTH EXTRACTION      OB History    Gravida  0   Para  0   Term  0   Preterm  0   AB  0   Living  0     SAB  0   IAB  0   Ectopic  0   Multiple  0   Live Births               Home Medications    Prior to Admission medications   Medication Sig Start Date End Date Taking? Authorizing Provider  benzonatate (TESSALON) 200 MG capsule Take 1 capsule (200 mg total) by mouth 3 (three) times daily as needed for up to 7 days for cough. 03/05/21 03/12/21 Yes Ottis Vacha C, PA-C  ibuprofen (ADVIL) 800 MG tablet Take 1 tablet (800 mg total) by mouth 3 (three) times daily. 03/05/21  Yes Leone Mobley, Junius Creamer, PA-C    Family History Family History  Problem Relation Age of Onset  . Diabetes Mother   . Diabetes Brother   . Diabetes Maternal Grandmother   . Breast cancer Maternal Grandmother   . Diabetes Maternal Grandfather   . Colon cancer Paternal Grandmother     Social History Social History   Tobacco Use  . Smoking status: Former Smoker    Types: Cigarettes  . Smokeless tobacco: Never Used  . Tobacco comment: smokes occasionally  Substance Use Topics   . Alcohol use: Not Currently    Alcohol/week: 0.0 standard drinks  . Drug use: No     Allergies   Patient has no known allergies.   Review of Systems Review of Systems  Constitutional: Positive for chills, fatigue and fever. Negative for activity change and appetite change.  HENT: Positive for sore throat. Negative for congestion, ear pain, rhinorrhea, sinus pressure and trouble swallowing.   Eyes: Negative for discharge and redness.  Respiratory: Positive for cough. Negative for chest tightness and shortness of breath.   Cardiovascular: Negative for chest pain.  Gastrointestinal: Negative for abdominal pain, diarrhea, nausea and vomiting.  Musculoskeletal: Negative for myalgias.  Skin: Negative for rash.  Neurological: Positive for headaches. Negative for dizziness and light-headedness.     Physical Exam Triage Vital Signs ED Triage Vitals  Enc Vitals Group     BP      Pulse      Resp      Temp      Temp src      SpO2      Weight      Height      Head Circumference  Peak Flow      Pain Score      Pain Loc      Pain Edu?      Excl. in GC?    No data found.  Updated Vital Signs BP 140/73 (BP Location: Left Arm)   Pulse 71   Temp (!) 102.9 F (39.4 C) (Oral)   Resp 20   SpO2 96%   Visual Acuity Right Eye Distance:   Left Eye Distance:   Bilateral Distance:    Right Eye Near:   Left Eye Near:    Bilateral Near:     Physical Exam Vitals and nursing note reviewed.  Constitutional:      Appearance: She is well-developed.     Comments: No acute distress  HENT:     Head: Normocephalic and atraumatic.     Ears:     Comments: Bilateral ears without tenderness to palpation of external auricle, tragus and mastoid, EAC's without erythema or swelling, TM's with good bony landmarks and cone of light. Non erythematous.     Nose: Nose normal.     Mouth/Throat:     Comments: Oral mucosa pink and moist, no tonsillar enlargement or exudate. Posterior  pharynx patent and nonerythematous, no uvula deviation or swelling. Normal phonation. Eyes:     Conjunctiva/sclera: Conjunctivae normal.  Cardiovascular:     Rate and Rhythm: Normal rate.  Pulmonary:     Effort: Pulmonary effort is normal. No respiratory distress.     Comments: Breathing comfortably at rest, CTABL, no wheezing, rales or other adventitious sounds auscultated Abdominal:     General: There is no distension.  Musculoskeletal:        General: Normal range of motion.     Cervical back: Neck supple.  Skin:    General: Skin is warm and dry.  Neurological:     Mental Status: She is alert and oriented to person, place, and time.      UC Treatments / Results  Labs (all labs ordered are listed, but only abnormal results are displayed) Labs Reviewed  COVID-19, FLU A+B NAA    EKG   Radiology No results found.  Procedures Procedures (including critical care time)  Medications Ordered in UC Medications  acetaminophen (TYLENOL) tablet 650 mg (650 mg Oral Given 03/05/21 0931)    Initial Impression / Assessment and Plan / UC Course  I have reviewed the triage vital signs and the nursing notes.  Pertinent labs & imaging results that were available during my care of the patient were reviewed by me and considered in my medical decision making (see chart for details).     COVID-19-at home test positive, will confirm with test today, recommending symptomatic and supportive care, at this time lungs clear and vital signs stable, no nuchal rigidity.  Recommending close monitoring.  Discussed strict return precautions. Patient verbalized understanding and is agreeable with plan.  Final Clinical Impressions(s) / UC Diagnoses   Final diagnoses:  Suspected COVID-19 virus infection     Discharge Instructions     Ibuprofen and Tylenol for fevers chills, body aches Rest and fluids May use over-the-counter medicine as needed for cough and congestion Follow-up if not  improving or worsening    ED Prescriptions    Medication Sig Dispense Auth. Provider   ibuprofen (ADVIL) 800 MG tablet Take 1 tablet (800 mg total) by mouth 3 (three) times daily. 21 tablet Danaysha Kirn C, PA-C   benzonatate (TESSALON) 200 MG capsule Take 1 capsule (200 mg total)  by mouth 3 (three) times daily as needed for up to 7 days for cough. 28 capsule Josanna Hefel, [place] C, PA-C     PDMP not reviewed this encounter.   Berda Shelvin, [place] C, PA-C 03/05/21 1023

## 2021-03-05 NOTE — ED Triage Notes (Signed)
Patient presents c/o chills, body aches, HA, fever.  Reports traveling out of town, symptoms started yesterday on the way home.    Reports fever last night 101.3, took home covid test +.   Has taken mucinex DM this morning at 5AM.

## 2021-03-05 NOTE — Discharge Instructions (Signed)
Ibuprofen and Tylenol for fevers chills, body aches Rest and fluids May use over-the-counter medicine as needed for cough and congestion Follow-up if not improving or worsening

## 2021-03-06 LAB — COVID-19, FLU A+B NAA
Influenza A, NAA: NOT DETECTED
Influenza B, NAA: NOT DETECTED
SARS-CoV-2, NAA: DETECTED — AB

## 2021-09-25 ENCOUNTER — Other Ambulatory Visit (HOSPITAL_COMMUNITY)
Admission: RE | Admit: 2021-09-25 | Discharge: 2021-09-25 | Disposition: A | Discharge status: Home / Self Care | Payer: No Typology Code available for payment source | Source: Ambulatory Visit | Attending: Obstetrics and Gynecology | Admitting: Obstetrics and Gynecology

## 2021-09-25 ENCOUNTER — Ambulatory Visit (INDEPENDENT_AMBULATORY_CARE_PROVIDER_SITE_OTHER): Payer: No Typology Code available for payment source | Admitting: Obstetrics and Gynecology

## 2021-09-25 ENCOUNTER — Other Ambulatory Visit: Payer: Self-pay

## 2021-09-25 ENCOUNTER — Encounter: Payer: Self-pay | Admitting: Obstetrics and Gynecology

## 2021-09-25 VITALS — BP 119/76 | HR 81 | Ht 65.0 in | Wt 201.0 lb

## 2021-09-25 DIAGNOSIS — Z01419 Encounter for gynecological examination (general) (routine) without abnormal findings: Secondary | ICD-10-CM

## 2021-09-25 NOTE — Progress Notes (Signed)
Subjective:     Holly Tran is a 25 y.o. female P0 with LMP 08/28/2021 and BMI 33 who is here for a comprehensive physical exam. The patient reports no problems. She reports a monthly period lasting 5 days. She is sexually active using natural family planning for contraception. She report the presence of a vaginal discharge with slight odor and is concerned she may have BV. Patient is without any other complaints. She denies pelvic pain.   History reviewed. No pertinent past medical history. Past Surgical History:  Procedure Laterality Date   WISDOM TOOTH EXTRACTION     Family History  Problem Relation Age of Onset   Diabetes Mother    Diabetes Brother    Diabetes Maternal Grandmother    Breast cancer Maternal Grandmother    Diabetes Maternal Grandfather    Colon cancer Paternal Grandmother     Social History   Socioeconomic History   Marital status: Single    Spouse name: Not on file   Number of children: Not on file   Years of education: Not on file   Highest education level: Not on file  Occupational History   Not on file  Tobacco Use   Smoking status: Former    Types: Cigarettes   Smokeless tobacco: Never   Tobacco comments:    smokes occasionally  Substance and Sexual Activity   Alcohol use: Not Currently    Alcohol/week: 0.0 standard drinks   Drug use: No   Sexual activity: Yes    Partners: Male    Birth control/protection: None  Other Topics Concern   Not on file  Social History Narrative   Not on file   Social Determinants of Health   Financial Resource Strain: Not on file  Food Insecurity: Not on file  Transportation Needs: Not on file  Physical Activity: Not on file  Stress: Not on file  Social Connections: Not on file  Intimate Partner Violence: Not on file   Health Maintenance  Topic Date Due   COVID-19 Vaccine (1) Never done   TETANUS/TDAP  Never done   INFLUENZA VACCINE  Never done   PAP-Cervical Cytology Screening  09/14/2023   PAP  SMEAR-Modifier  09/14/2023   Hepatitis C Screening  Completed   HIV Screening  Completed   Pneumococcal Vaccine 65-61 Years old  Aged Out   HPV VACCINES  Aged Out       Review of Systems Pertinent items noted in HPI and remainder of comprehensive ROS otherwise negative.   Objective:  Blood pressure 119/76, pulse 81, height 5\' 5"  (1.651 m), weight 201 lb (91.2 kg), last menstrual period 08/28/2021.   GENERAL: Well-developed, well-nourished female in no acute distress.  HEENT: Normocephalic, atraumatic. Sclerae anicteric.  NECK: Supple. Normal thyroid.  LUNGS: Clear to auscultation bilaterally.  HEART: Regular rate and rhythm. BREASTS: Symmetric in size. No palpable masses or lymphadenopathy, skin changes, or nipple drainage. ABDOMEN: Soft, nontender, nondistended. No organomegaly. PELVIC: Normal external female genitalia. Vagina is pink and rugated.  Normal discharge. Normal appearing cervix. Uterus is normal in size. No adnexal mass or tenderness. Chaperone present during the pelvic exam EXTREMITIES: No cyanosis, clubbing, or edema, 2+ distal pulses.     Assessment:    Healthy female exam.      Plan:    Pap smear collected STI screening per patient request Vaginal swab collected to rule out BV Patient will be contacted with abnormal results Patient encouraged to take prenatal vitamins See After Visit Summary for Counseling Recommendations

## 2021-09-26 LAB — RPR: RPR Ser Ql: NONREACTIVE

## 2021-09-26 LAB — CERVICOVAGINAL ANCILLARY ONLY
Bacterial Vaginitis (gardnerella): NEGATIVE
Candida Glabrata: NEGATIVE
Candida Vaginitis: NEGATIVE
Chlamydia: NEGATIVE
Comment: NEGATIVE
Comment: NEGATIVE
Comment: NEGATIVE
Comment: NEGATIVE
Comment: NEGATIVE
Comment: NORMAL
Neisseria Gonorrhea: NEGATIVE
Trichomonas: NEGATIVE

## 2021-09-26 LAB — HEPATITIS C ANTIBODY: Hep C Virus Ab: <0.1 s/co ratio (ref 0.0–0.9)

## 2021-09-26 LAB — HEPATITIS B SURFACE ANTIGEN: Hepatitis B Surface Ag: NEGATIVE

## 2021-09-26 LAB — HIV ANTIBODY (ROUTINE TESTING W REFLEX): HIV Screen 4th Generation wRfx: NONREACTIVE

## 2021-09-27 LAB — CYTOLOGY - PAP
Diagnosis: NEGATIVE
Diagnosis: REACTIVE

## 2022-02-25 ENCOUNTER — Other Ambulatory Visit (HOSPITAL_COMMUNITY)
Admission: RE | Admit: 2022-02-25 | Discharge: 2022-02-25 | Disposition: A | Discharge status: Home / Self Care | Payer: No Typology Code available for payment source | Source: Ambulatory Visit | Attending: Obstetrics and Gynecology | Admitting: Obstetrics and Gynecology

## 2022-02-25 ENCOUNTER — Ambulatory Visit (INDEPENDENT_AMBULATORY_CARE_PROVIDER_SITE_OTHER): Payer: No Typology Code available for payment source | Admitting: Obstetrics and Gynecology

## 2022-02-25 ENCOUNTER — Encounter: Payer: Self-pay | Admitting: Obstetrics and Gynecology

## 2022-02-25 VITALS — BP 125/71 | HR 96 | Ht 65.0 in | Wt 206.0 lb

## 2022-02-25 DIAGNOSIS — Z113 Encounter for screening for infections with a predominantly sexual mode of transmission: Secondary | ICD-10-CM | POA: Diagnosis not present

## 2022-02-25 DIAGNOSIS — Z131 Encounter for screening for diabetes mellitus: Secondary | ICD-10-CM

## 2022-02-25 DIAGNOSIS — Z3009 Encounter for other general counseling and advice on contraception: Secondary | ICD-10-CM

## 2022-02-25 MED ORDER — NORETHIN ACE-ETH ESTRAD-FE 1-20 MG-MCG(24) PO TABS: 1.0000 | ORAL_TABLET | Freq: Every day | ORAL | 11 refills | Status: DC

## 2022-02-25 NOTE — Progress Notes (Signed)
GYN pt in office for STD testing. Pt also has concerns of "skin tag" in the vaginal area and would like it looked at. Pt also requesting to have her A1C checked today stating her was prediabetic the last time it was checked. She is interested in birth control.  ?

## 2022-02-25 NOTE — Progress Notes (Signed)
26 yo P0 with LMP 02/10/22 and BMI 34 presenting today for STI screening, repeat diabetes screening and evaluation of a vulva skin tag. Patient reports noticing a skin tag a few days ago while washing. It has since not been palpable. Patient is also interested in re-initiating birth control pills. Patient is without any other complaints. She denies pelvic pain or abnormal discharge. In light of pre-diabetes diagnosis 2 years ago, patient admits to increasing her fruit and vegetable intake ? ?History reviewed. No pertinent past medical history. ?Past Surgical History:  ?Procedure Laterality Date  ? WISDOM TOOTH EXTRACTION    ? ?Family History  ?Problem Relation Age of Onset  ? Diabetes Mother   ? Diabetes Brother   ? Diabetes Maternal Grandmother   ? Breast cancer Maternal Grandmother   ? Diabetes Maternal Grandfather   ? Colon cancer Paternal Grandmother   ? ?Social History  ? ?Tobacco Use  ? Smoking status: Former  ?  Types: Cigarettes  ? Smokeless tobacco: Never  ? Tobacco comments:  ?  smokes occasionally  ?Substance Use Topics  ? Alcohol use: Yes  ?  Comment: occ  ? Drug use: No  ? ?ROS ?See pertinent in HPI. All other systems reviewed and non contributory ?Blood pressure 125/71, pulse 96, height 5\' 5"  (1.651 m), weight 206 lb (93.4 kg), last menstrual period 02/10/2022. ?GENERAL: Well-developed, well-nourished female in no acute distress.  ?PELVIC: Normal external female genitalia. Vagina is pink and rugated.  Normal discharge. Chaperone present during the pelvic exam ?EXTREMITIES: No cyanosis, clubbing, or edema, 2+ distal pulses. ? ?A/P 26 yo here for STI testing and contraception initiation ?- Reassurance provided regarding normal vulva without lesions ?- Vaginal swab and STI screening per patient request ?- A1c today (previously 5.8 in 2021) ?- Rx loestrin provided ?- Patient will be contacted with abnormal results ?

## 2022-02-26 LAB — HEPATITIS C ANTIBODY: Hep C Virus Ab: NONREACTIVE

## 2022-02-26 LAB — CERVICOVAGINAL ANCILLARY ONLY
Chlamydia: NEGATIVE
Comment: NEGATIVE
Comment: NORMAL
Neisseria Gonorrhea: NEGATIVE

## 2022-02-26 LAB — HIV ANTIBODY (ROUTINE TESTING W REFLEX): HIV Screen 4th Generation wRfx: NONREACTIVE

## 2022-02-26 LAB — HEPATITIS B SURFACE ANTIGEN: Hepatitis B Surface Ag: NEGATIVE

## 2022-02-26 LAB — HEMOGLOBIN A1C
Est. average glucose Bld gHb Est-mCnc: 114 mg/dL
Hgb A1c MFr Bld: 5.6 % (ref 4.8–5.6)

## 2022-02-26 LAB — RPR: RPR Ser Ql: NONREACTIVE

## 2023-01-14 ENCOUNTER — Other Ambulatory Visit: Payer: Self-pay | Admitting: Obstetrics and Gynecology

## 2023-02-26 ENCOUNTER — Ambulatory Visit (INDEPENDENT_AMBULATORY_CARE_PROVIDER_SITE_OTHER): Payer: 59 | Admitting: Obstetrics and Gynecology

## 2023-02-26 ENCOUNTER — Other Ambulatory Visit (HOSPITAL_COMMUNITY)
Admission: RE | Admit: 2023-02-26 | Discharge: 2023-02-26 | Disposition: A | Discharge status: Home / Self Care | Payer: 59 | Source: Ambulatory Visit | Attending: Obstetrics and Gynecology | Admitting: Obstetrics and Gynecology

## 2023-02-26 ENCOUNTER — Encounter: Payer: Self-pay | Admitting: Obstetrics and Gynecology

## 2023-02-26 VITALS — BP 112/75 | HR 85 | Ht 65.0 in | Wt 221.0 lb

## 2023-02-26 DIAGNOSIS — Z01419 Encounter for gynecological examination (general) (routine) without abnormal findings: Secondary | ICD-10-CM | POA: Insufficient documentation

## 2023-02-26 DIAGNOSIS — E669 Obesity, unspecified: Secondary | ICD-10-CM | POA: Diagnosis not present

## 2023-02-26 DIAGNOSIS — Z6836 Body mass index (BMI) 36.0-36.9, adult: Secondary | ICD-10-CM

## 2023-02-26 MED ORDER — NORETHIN ACE-ETH ESTRAD-FE 1-20 MG-MCG(24) PO TABS: 1.0000 | ORAL_TABLET | Freq: Every day | ORAL | 11 refills | Status: DC

## 2023-02-26 NOTE — Progress Notes (Signed)
Pt is in office for routine exam. Pt would like all testing including lab work today including A1C.  Pt had lapse in [facility]- late for cycle has had multiple negative home test.

## 2023-02-26 NOTE — Progress Notes (Signed)
Subjective:     Britainy Tran is a 27 y.o. female P0 with LMP 01/10/23 and BMI 36 who is here for a routine exam.  Current complaints: no complaints. She is sexually active using  COC for contraception. She denies pelvic pain or abnormal discharge. She is not planning a pregnancy in the next 12 months. She is requesting STI testing. .   Gynecologic History Patient's last menstrual period was 01/10/2023. Contraception: OCP (estrogen/progesterone) Last Pap: 02/25/2022. Results were: normal   Obstetric History OB History  Gravida Para Term Preterm AB Living  0 0 0 0 0 0  SAB IAB Ectopic Multiple Live Births  0 0 0 0       Review of Systems Pertinent items noted in HPI and remainder of comprehensive ROS otherwise negative.    Objective:  Blood pressure 112/75, pulse 85, height 5\' 5"  (1.651 m), weight 221 lb (100.2 kg), last menstrual period 01/10/2023.   GENERAL: Well-developed, well-nourished female in no acute distress.  HEENT: Normocephalic, atraumatic. Sclerae anicteric.  NECK: Supple. Normal thyroid.  LUNGS: Clear to auscultation bilaterally.  HEART: Regular rate and rhythm. BREASTS: Symmetric in size. No palpable masses or lymphadenopathy, skin changes, or nipple drainage. ABDOMEN: Soft, nontender, nondistended. No organomegaly. PELVIC: Normal external female genitalia. Vagina is pink and rugated.  Normal discharge. Normal appearing cervix. Uterus is normal in size. No adnexal mass or tenderness. Chaperone present during the pelvic exam EXTREMITIES: No cyanosis, clubbing, or edema, 2+ distal pulses.     Assessment:    Healthy female exam.    Plan:    Pap smear collected STI screening per patient request A1C added Refill on COC provided Patient referred to nutritionist to assist with weight loss Patient will be contacted with abnormal results

## 2023-02-27 LAB — HEMOGLOBIN A1C
Est. average glucose Bld gHb Est-mCnc: 126 mg/dL
Hgb A1c MFr Bld: 6 % — ABNORMAL HIGH (ref 4.8–5.6)

## 2023-02-27 LAB — CERVICOVAGINAL ANCILLARY ONLY
Bacterial Vaginitis (gardnerella): NEGATIVE
Candida Glabrata: NEGATIVE
Candida Vaginitis: NEGATIVE
Chlamydia: NEGATIVE
Comment: NEGATIVE
Comment: NEGATIVE
Comment: NEGATIVE
Comment: NEGATIVE
Comment: NEGATIVE
Comment: NORMAL
Neisseria Gonorrhea: NEGATIVE
Trichomonas: NEGATIVE

## 2023-02-27 LAB — CYTOLOGY - PAP: Diagnosis: NEGATIVE

## 2023-02-27 LAB — HEPATITIS C ANTIBODY: Hep C Virus Ab: NONREACTIVE

## 2023-02-27 LAB — HEPATITIS B SURFACE ANTIGEN: Hepatitis B Surface Ag: NEGATIVE

## 2023-02-27 LAB — RPR: RPR Ser Ql: NONREACTIVE

## 2023-02-27 LAB — HIV ANTIBODY (ROUTINE TESTING W REFLEX): HIV Screen 4th Generation wRfx: NONREACTIVE

## 2023-03-17 ENCOUNTER — Ambulatory Visit (INDEPENDENT_AMBULATORY_CARE_PROVIDER_SITE_OTHER): Payer: 59 | Admitting: Family Medicine

## 2023-03-17 ENCOUNTER — Encounter: Payer: Self-pay | Admitting: Family Medicine

## 2023-03-17 VITALS — BP 114/78 | HR 93 | Temp 98.5°F | Resp 16 | Ht 65.0 in | Wt 223.6 lb

## 2023-03-17 DIAGNOSIS — J302 Other seasonal allergic rhinitis: Secondary | ICD-10-CM | POA: Diagnosis not present

## 2023-03-17 DIAGNOSIS — E6609 Other obesity due to excess calories: Secondary | ICD-10-CM | POA: Diagnosis not present

## 2023-03-17 DIAGNOSIS — Z7689 Persons encountering health services in other specified circumstances: Secondary | ICD-10-CM | POA: Diagnosis not present

## 2023-03-17 DIAGNOSIS — Z6837 Body mass index (BMI) 37.0-37.9, adult: Secondary | ICD-10-CM

## 2023-03-17 MED ORDER — CETIRIZINE HCL 10 MG PO TABS: 10.0000 mg | ORAL_TABLET | Freq: Every day | ORAL | 3 refills | Status: DC

## 2023-03-20 ENCOUNTER — Encounter: Payer: Self-pay | Admitting: Family Medicine

## 2023-03-20 NOTE — Progress Notes (Signed)
New Patient Office Visit  Subjective    Patient ID: Holly Tran, female    DOB: 1996/10/25  Age: 27 y.o. MRN: [id number]  CC: No chief complaint on file.   HPI Holly Tran presents to establish care and with complaint of seasonal allergies.    Outpatient Encounter Medications as of 03/17/2023  Medication Sig   cetirizine (ZYRTEC) 10 MG tablet Take 1 tablet (10 mg total) by mouth daily.   ibuprofen (ADVIL) 800 MG tablet Take 1 tablet (800 mg total) by mouth 3 (three) times daily. (Patient not taking: Reported on 09/25/2021)   Norethindrone Acetate-Ethinyl Estrad-FE (LOESTRIN 24 FE) 1-20 MG-MCG(24) tablet Take 1 tablet by mouth daily.   [DISCONTINUED] cetirizine (ZYRTEC) 10 MG tablet Take 10 mg by mouth daily.   No facility-administered encounter medications on file as of 03/17/2023.    No past medical history on file.  Past Surgical History:  Procedure Laterality Date   WISDOM TOOTH EXTRACTION      Family History  Problem Relation Age of Onset   Diabetes Mother    Diabetes Brother    Diabetes Maternal Grandmother    Breast cancer Maternal Grandmother    Diabetes Maternal Grandfather    Colon cancer Paternal Grandmother     Social History   Socioeconomic History   Marital status: Single    Spouse name: Not on file   Number of children: Not on file   Years of education: Not on file   Highest education level: Not on file  Occupational History   Not on file  Tobacco Use   Smoking status: Former    Types: Cigarettes   Smokeless tobacco: Never   Tobacco comments:    smokes occasionally  Vaping Use   Vaping Use: Never used  Substance and Sexual Activity   Alcohol use: Yes    Comment: occ   Drug use: No   Sexual activity: Yes    Partners: Male    Birth control/protection: Pill  Other Topics Concern   Not on file  Social History Narrative   Not on file   Social Determinants of Health   Financial Resource Strain: Not on file  Food Insecurity: Not on  file  Transportation Needs: Not on file  Physical Activity: Not on file  Stress: Not on file  Social Connections: Not on file  Intimate Partner Violence: Not on file    Review of Systems  Endo/Heme/Allergies:  Positive for environmental allergies.  All other systems reviewed and are negative.       Objective    BP 114/78   Pulse 93   Temp 98.5 F (36.9 C) (Oral)   Resp 16   Ht 5\' 5"  (1.651 m)   Wt 223 lb 9.6 oz (101.4 kg)   LMP 01/10/2023   SpO2 (!) 7%   BMI 37.21 kg/m   Physical Exam Vitals and nursing note reviewed.  Constitutional:      General: Holly Tran is not in acute distress.    Appearance: Holly Tran is obese.  HENT:     Nose: Congestion present. No rhinorrhea.  Cardiovascular:     Rate and Rhythm: Normal rate and regular rhythm.  Pulmonary:     Effort: Pulmonary effort is normal.     Breath sounds: Normal breath sounds.  Neurological:     General: No focal deficit present.     Mental Status: Holly Tran is alert and oriented to person, place, and time.         Assessment &  Plan:   1. Seasonal allergies Zyrtec prescribed.   2. Class 2 obesity due to excess calories without serious comorbidity with body mass index (BMI) of 37.0 to 37.9 in adult Discussed dietary and activity options  3. Encounter to establish care   Return if symptoms worsen or fail to improve.   [name], MD

## 2023-05-12 ENCOUNTER — Encounter: Payer: Self-pay | Admitting: Skilled Nursing Facility1

## 2023-05-12 ENCOUNTER — Encounter: Payer: 59 | Attending: Obstetrics and Gynecology | Admitting: Skilled Nursing Facility1

## 2023-05-12 VITALS — Ht 65.0 in | Wt 217.0 lb

## 2023-05-12 DIAGNOSIS — E631 Imbalance of constituents of food intake: Secondary | ICD-10-CM | POA: Insufficient documentation

## 2023-05-12 NOTE — Progress Notes (Signed)
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  Pt states she has cut out meat, dairy, and only ate fruit and vegetables mostly raw.  Pt states she fasted until 12.   Body Composition Scale 07/51/2024  Current Body Weight 217.1  Total Body Fat % 39.5  Visceral Fat 9  Fat-Free Mass % 60.4   Total Body Water % 44.7  Muscle-Mass lbs 33.8  BMI 35.8  Body Fat Displacement          Torso  lbs 53.1         Left Leg  lbs 10.6         Right Leg  lbs 10.6         Left Arm  lbs 5.3         Right Arm   lbs 5.3   Estimated daily fluid intake:   Supplements: N/A Sleep:  Stress / self-care: low stress Current average weekly physical activity: ADL's  24-Hr Dietary Recall First Meal: fasting Snack:  Second Meal: smoothie Snack: apple or orange  Third Meal: salad Snack: nuts or apple or watermelon Beverages: water   NUTRITION INTERVENTION  Nutrition education (E-1) on the following topics:  Creation of balanced and diverse meals to increase the intake of nutrient-rich foods that provide essential vitamins, minerals, fiber, and phytonutrients Variety of Fruits and Vegetables: Aim for a colorful array of fruits and vegetables to ensure a wide range of nutrients. Include a mix of leafy greens, berries, citrus fruits, cruciferous vegetables, and more. Whole Grains: Choose whole grains over refined grains. Examples include brown rice, quinoa, oats, whole wheat, and barley. Lean Proteins: Include lean sources of protein, such as poultry, fish, tofu, legumes, beans, lentils, and low-fat dairy products. Limit red and processed meats. Healthy Fats: Incorporate sources of healthy fats, including avocados, nuts, seeds,  and olive oil. Limit saturated and trans fats found in fried and processed foods. Dairy or Dairy Alternatives: Choose low-fat or fat-free dairy products, or plant-based alternatives like almond or soy milk. Portion Control: Be mindful of portion sizes to avoid overeating. Pay attention to hunger and satisfaction cues. Limit Added Sugars: Minimize the consumption of sugary beverages, snacks, and desserts. Check food labels for added sugars and opt for natural sources of sweetness such as whole fruits. Hydration: Drink plenty of water throughout the day. Limit sugary drinks and excessive caffeine intake. Moderate Sodium Intake: Reduce the consumption of high-sodium foods. Use herbs and spices for flavor instead of excessive salt. Meal Planning and Preparation: Plan and prepare meals ahead of time to make healthier choices more convenient. Include a mix of food groups in each meal. Limit Processed Foods: Minimize the intake of highly processed and packaged foods that are often high in added sugars, salt, and unhealthy fats. Regular Physical Activity: Combine a healthy diet with regular physical activity for overall well-being. Aim for at least 150 minutes of moderate-intensity aerobic exercise per week, along with strength training. Moderation and Balance: Enjoy treats and indulgent foods in moderation, emphasizing balance rather than strict restriction.  Handouts Provided Include  Detailed MyPlate  Learning Style & Readiness for Change Teaching method utilized: Visual & Auditory  Demonstrated degree of understanding via: Teach Back  Barriers to  learning/adherence to lifestyle change: none identified   Goals Established by Pt Drink 100 ounces of fluid per day Include more balanced meals   MONITORING & EVALUATION Dietary intake, weekly physical activity  Next Steps  Patient is to follow up in 6-8 weeks.

## 2023-05-13 ENCOUNTER — Ambulatory Visit: Payer: 59 | Admitting: Family Medicine

## 2023-05-28 DIAGNOSIS — F411 Generalized anxiety disorder: Secondary | ICD-10-CM | POA: Diagnosis not present

## 2023-06-04 ENCOUNTER — Telehealth: Payer: Self-pay | Admitting: Family Medicine

## 2023-06-04 NOTE — Telephone Encounter (Signed)
Called pt and left vm to call office back to schedule appt requested via MyChart. 

## 2023-06-16 DIAGNOSIS — F411 Generalized anxiety disorder: Secondary | ICD-10-CM | POA: Diagnosis not present

## 2023-06-24 DIAGNOSIS — F411 Generalized anxiety disorder: Secondary | ICD-10-CM | POA: Diagnosis not present

## 2023-06-25 ENCOUNTER — Ambulatory Visit: Payer: 59 | Admitting: Family Medicine

## 2023-07-04 ENCOUNTER — Encounter: Payer: 59 | Attending: Obstetrics and Gynecology | Admitting: Dietician

## 2023-07-04 ENCOUNTER — Encounter: Payer: Self-pay | Admitting: Dietician

## 2023-07-04 VITALS — Wt 213.0 lb

## 2023-07-04 DIAGNOSIS — E669 Obesity, unspecified: Secondary | ICD-10-CM | POA: Insufficient documentation

## 2023-07-04 NOTE — Progress Notes (Signed)
Medical Nutrition Therapy   Primary concerns today: to lose weight   Referral diagnosis: e66.9  [employer] Employee Visit 2  NUTRITION ASSESSMENT   Clinical Medical Hx: reviewed Medications: phentermine  Labs: A1C 6.0 02/26/23 Notable Signs/Symptoms: N/A  Lifestyle & Dietary Hx  Weight today 213 lb. Weight down 4 lb since previous visit.   Pt states she has been on more trips, more parties, and was drinking more alcohol last month. She states she wasn't consistent with nutrition/goals in August.   Pt states she has been drinking at least 64 oz of water daily and is continuing to work toward her goal of 100 oz.   Pt states she started back meal prepping this week. She states she went to [employer] and got chicken, brussels sprouts and mac and cheese.   Pt states she wants to start walking. She states some of her coworkers walk on lunch but she hasn't been. Works 7am-7pm, 3-4 days per week. 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  From 05/12/23: Body Composition Scale 05/12/2023  Current Body Weight 217.1  Total Body Fat % 39.5  Visceral Fat 9  Fat-Free Mass % 60.4   Total Body Water % 44.7  Muscle-Mass lbs 33.8  BMI 35.8  Body Fat Displacement          Torso  lbs 53.1         Left Leg  lbs 10.6         Right Leg  lbs 10.6         Left Arm  lbs 5.3         Right Arm   lbs 5.3   Estimated daily fluid intake:  64+ oz Supplements: N/A Sleep:  Stress / self-care: low stress Current average weekly physical activity: ADL's  24-Hr Dietary Recall First Meal: juice (carrot and pineapple) Snack: none Second Meal: chicken and brussel sprouts Snack: chex mix Third Meal: chicken and brussels sprouts and mac and cheese Snack: none OR maybe [place] ice Beverages: 64+ oz water, juice (homemade)   NUTRITION INTERVENTION  Nutrition education (E-1) on the following topics:  Creation of balanced and diverse meals to increase the intake of nutrient-rich foods that provide  essential vitamins, minerals, fiber, and phytonutrients Variety of Fruits and Vegetables: Aim for a colorful array of fruits and vegetables to ensure a wide range of nutrients. Include a mix of leafy greens, berries, citrus fruits, cruciferous vegetables, and more. Whole Grains: Choose whole grains over refined grains. Examples include brown rice, quinoa, oats, whole wheat, and barley. Lean Proteins: Include lean sources of protein, such as poultry, fish, tofu, legumes, beans, lentils, and low-fat dairy products. Limit red and processed meats. Healthy Fats: Incorporate sources of healthy fats, including avocados, nuts, seeds, and olive oil. Limit saturated and trans fats found in fried and processed foods. Dairy or Dairy Alternatives: Choose low-fat or fat-free dairy products, or plant-based alternatives like almond or soy milk. Portion Control: Be mindful of portion sizes to avoid overeating. Pay attention to hunger and satisfaction cues. Limit Added Sugars: Minimize the consumption of sugary beverages, snacks, and desserts. Hydration: Drink plenty of water throughout the day. Limit sugary drinks and excessive caffeine intake. Meal Planning and Preparation: Plan and prepare meals ahead of time to make healthier choices more convenient. Regular Physical Activity: Combine a healthy diet with regular physical activity for overall well-being. Aim for at least 150 minutes of moderate-intensity aerobic exercise per week, along with strength training. Moderation  and Balance: Enjoy treats and indulgent foods in moderation, emphasizing balance rather than strict restriction.  Handouts Provided Include  No handouts provided on this follow up  Learning Style & Readiness for Change Teaching method utilized: Visual & Auditory  Demonstrated degree of understanding via: Teach Back  Barriers to learning/adherence to lifestyle change: none identified   Goals Established by Pt  Previous Goals Great job on  working toward your goals! Drink 100 ounces of fluid per day - in progress, continue Include more balanced meals - in progress, continue  New Goals:  1: Go walking for 30-45 minutes around the neighborhood at least 1 time per week.  2: Cook at home at least 2 times a week (Sunday and Wednesday are the days we talked about).   MONITORING & EVALUATION Dietary intake, weekly physical activity  Next Steps  Patient is to follow up in 6-8 weeks.

## 2023-07-04 NOTE — Patient Instructions (Signed)
Previous Goals Drink 100 ounces of fluid per day Include more balanced meals  New Goals:  1: Go walking for 30-45 minutes around the neighborhood at least 1 time per week.  2: Cook at home at least 2 times a week (Sunday and Wednesday are the days we talked about).

## 2023-07-10 ENCOUNTER — Other Ambulatory Visit (HOSPITAL_COMMUNITY)
Admission: RE | Admit: 2023-07-10 | Discharge: 2023-07-10 | Disposition: A | Discharge status: Home / Self Care | Payer: 59 | Source: Ambulatory Visit | Attending: Family Medicine | Admitting: Family Medicine

## 2023-07-10 ENCOUNTER — Encounter: Payer: Self-pay | Admitting: Family Medicine

## 2023-07-10 ENCOUNTER — Ambulatory Visit (INDEPENDENT_AMBULATORY_CARE_PROVIDER_SITE_OTHER): Payer: 59 | Admitting: Family Medicine

## 2023-07-10 VITALS — BP 118/79 | HR 92 | Temp 98.4°F | Resp 16 | Ht 65.0 in | Wt 212.0 lb

## 2023-07-10 DIAGNOSIS — E6609 Other obesity due to excess calories: Secondary | ICD-10-CM

## 2023-07-10 DIAGNOSIS — R300 Dysuria: Secondary | ICD-10-CM | POA: Insufficient documentation

## 2023-07-10 DIAGNOSIS — Z202 Contact with and (suspected) exposure to infections with a predominantly sexual mode of transmission: Secondary | ICD-10-CM | POA: Insufficient documentation

## 2023-07-10 DIAGNOSIS — Z113 Encounter for screening for infections with a predominantly sexual mode of transmission: Secondary | ICD-10-CM | POA: Insufficient documentation

## 2023-07-10 DIAGNOSIS — Z7689 Persons encountering health services in other specified circumstances: Secondary | ICD-10-CM | POA: Diagnosis not present

## 2023-07-10 DIAGNOSIS — Z6837 Body mass index (BMI) 37.0-37.9, adult: Secondary | ICD-10-CM | POA: Diagnosis not present

## 2023-07-10 LAB — POCT URINALYSIS DIP (CLINITEK)
Bilirubin, UA: NEGATIVE
Blood, UA: NEGATIVE
Glucose, UA: NEGATIVE mg/dL
Ketones, POC UA: NEGATIVE mg/dL
Leukocytes, UA: NEGATIVE
Nitrite, UA: NEGATIVE
POC PROTEIN,UA: 30 — AB
Spec Grav, UA: >=1.03 — AB (ref 1.010–1.025)
Urobilinogen, UA: 0.2 U/dL
pH, UA: 7 (ref 5.0–8.0)

## 2023-07-10 MED ORDER — PHENTERMINE HCL 37.5 MG PO TABS: 37.5000 mg | ORAL_TABLET | Freq: Every day | ORAL | 0 refills | Status: DC

## 2023-07-10 NOTE — Progress Notes (Signed)
Possible uti, STD Check, refill medication

## 2023-07-11 ENCOUNTER — Encounter: Payer: Self-pay | Admitting: Family Medicine

## 2023-07-11 LAB — CERVICOVAGINAL ANCILLARY ONLY
Bacterial Vaginitis (gardnerella): NEGATIVE
Candida Glabrata: NEGATIVE
Candida Vaginitis: NEGATIVE
Chlamydia: NEGATIVE
Comment: NEGATIVE
Comment: NEGATIVE
Comment: NEGATIVE
Comment: NEGATIVE
Comment: NEGATIVE
Comment: NORMAL
Neisseria Gonorrhea: NEGATIVE
Trichomonas: NEGATIVE

## 2023-07-11 NOTE — Progress Notes (Signed)
Established Patient Office Visit  Subjective    Patient ID: Holly Tran, female    DOB: [birth year]  Age: 27 y.o. MRN: [id number]  CC:  Chief Complaint  Patient presents with   possible uti    STD check, medication refill    HPI Holly Tran presents with complaint of dysuria and possible std exposure. Patient also for weight management.   Outpatient Encounter Medications as of 07/10/2023  Medication Sig   cetirizine (ZYRTEC) 10 MG tablet Take 1 tablet (10 mg total) by mouth daily.   Norethindrone Acetate-Ethinyl Estrad-FE (LOESTRIN 24 FE) 1-20 MG-MCG(24) tablet Take 1 tablet by mouth daily.   [DISCONTINUED] phentermine (ADIPEX-P) 37.5 MG tablet Take 37.5 mg by mouth daily before breakfast.   ibuprofen (ADVIL) 800 MG tablet Take 1 tablet (800 mg total) by mouth 3 (three) times daily. (Patient not taking: Reported on 07/10/2023)   phentermine (ADIPEX-P) 37.5 MG tablet Take 1 tablet (37.5 mg total) by mouth daily before breakfast.   No facility-administered encounter medications on file as of 07/10/2023.    History reviewed. No pertinent past medical history.  Past Surgical History:  Procedure Laterality Date   WISDOM TOOTH EXTRACTION      Family History  Problem Relation Age of Onset   Diabetes Mother    Diabetes Brother    Diabetes Maternal Grandmother    Breast cancer Maternal Grandmother    Diabetes Maternal Grandfather    Colon cancer Paternal Grandmother     Social History   Socioeconomic History   Marital status: Single    Spouse name: Not on file   Number of children: Not on file   Years of education: Not on file   Highest education level: Master's degree (e.g., MA, MS, MEng, MEd, MSW, MBA)  Occupational History   Not on file  Tobacco Use   Smoking status: Former    Types: Cigarettes   Smokeless tobacco: Never   Tobacco comments:    smokes occasionally  Vaping Use   Vaping status: Never Used  Substance and Sexual Activity   Alcohol use: Yes     Comment: occ   Drug use: No   Sexual activity: Yes    Partners: Male    Birth control/protection: Pill  Other Topics Concern   Not on file  Social History Narrative   Not on file   Social Determinants of Health   Financial Resource Strain: Low Risk  (07/08/2023)   Overall Financial Resource Strain (CARDIA)    Difficulty of Paying Living Expenses: Not hard at all  Food Insecurity: No Food Insecurity (07/08/2023)   Hunger Vital Sign    Worried About Running Out of Food in the Last Year: Never true    Ran Out of Food in the Last Year: Never true  Transportation Needs: No Transportation Needs (07/08/2023)   PRAPARE - Administrator, Civil Service (Medical): No    Lack of Transportation (Non-Medical): No  Physical Activity: Unknown (07/08/2023)   Exercise Vital Sign    Days of Exercise per Week: 0 days    Minutes of Exercise per Session: Not on file  Stress: No Stress Concern Present (07/10/2023)   [employer] of Occupational Health - Occupational Stress Questionnaire    Feeling of Stress : Not at all  Social Connections: Moderately Isolated (07/08/2023)   Social Connection and Isolation Panel [NHANES]    Frequency of Communication with Friends and Family: More than three times a week    Frequency of Social  Gatherings with Friends and Family: Once a week    Attends Religious Services: More than 4 times per year    Active Member of [employer] or Organizations: No    Attends Engineer, structural: Not on file    Marital Status: Never married  Intimate Partner Violence: Not At Risk (07/10/2023)   Humiliation, Afraid, Rape, and Kick questionnaire    Fear of Current or Ex-Partner: No    Emotionally Abused: No    Physically Abused: No    Sexually Abused: No    Review of Systems  Genitourinary:  Positive for dysuria.  All other systems reviewed and are negative.       Objective    BP 118/79 (BP Location: Right Arm, Patient Position: Sitting, Cuff Size: Large)    Pulse 92   Temp 98.4 F (36.9 C) (Oral)   Resp 16   Ht 5\' 5"  (1.651 m)   Wt 212 lb (96.2 kg)   SpO2 95%   BMI 35.28 kg/m   Physical Exam Vitals and nursing note reviewed.  Constitutional:      General: She is not in acute distress. Cardiovascular:     Rate and Rhythm: Normal rate and regular rhythm.  Pulmonary:     Effort: Pulmonary effort is normal.     Breath sounds: Normal breath sounds.  Abdominal:     Palpations: Abdomen is soft.     Tenderness: There is no abdominal tenderness.  Neurological:     General: No focal deficit present.     Mental Status: She is alert and oriented to person, place, and time.         Assessment & Plan:   Dysuria -     POCT URINALYSIS DIP (CLINITEK)  Possible exposure to STD -     Cervicovaginal ancillary only  Encounter for weight management  Class 2 obesity due to excess calories without serious comorbidity with body mass index (BMI) of 37.0 to 37.9 in adult  Other orders -     Phentermine HCl; Take 1 tablet (37.5 mg total) by mouth daily before breakfast.  Dispense: 30 tablet; Refill: 0     Return in about 4 weeks (around 08/07/2023) for weight management.   [name], MD

## 2023-07-14 DIAGNOSIS — F411 Generalized anxiety disorder: Secondary | ICD-10-CM | POA: Diagnosis not present

## 2023-07-25 DIAGNOSIS — F411 Generalized anxiety disorder: Secondary | ICD-10-CM | POA: Diagnosis not present

## 2023-07-31 DIAGNOSIS — F411 Generalized anxiety disorder: Secondary | ICD-10-CM | POA: Diagnosis not present

## 2023-08-05 DIAGNOSIS — F411 Generalized anxiety disorder: Secondary | ICD-10-CM | POA: Diagnosis not present

## 2023-08-12 ENCOUNTER — Other Ambulatory Visit: Payer: Self-pay | Admitting: Family Medicine

## 2023-08-14 DIAGNOSIS — F411 Generalized anxiety disorder: Secondary | ICD-10-CM | POA: Diagnosis not present

## 2023-08-21 DIAGNOSIS — F411 Generalized anxiety disorder: Secondary | ICD-10-CM | POA: Diagnosis not present

## 2023-08-28 ENCOUNTER — Other Ambulatory Visit: Payer: Self-pay | Admitting: Family Medicine

## 2023-09-04 DIAGNOSIS — F411 Generalized anxiety disorder: Secondary | ICD-10-CM | POA: Diagnosis not present

## 2023-09-11 DIAGNOSIS — F411 Generalized anxiety disorder: Secondary | ICD-10-CM | POA: Diagnosis not present

## 2023-09-15 ENCOUNTER — Encounter: Payer: 59 | Attending: Obstetrics and Gynecology | Admitting: Dietician

## 2023-09-15 ENCOUNTER — Encounter: Payer: Self-pay | Admitting: Dietician

## 2023-09-15 DIAGNOSIS — E669 Obesity, unspecified: Secondary | ICD-10-CM | POA: Insufficient documentation

## 2023-09-15 NOTE — Patient Instructions (Addendum)
Goals:  1) Plan your exercise time: prep clothes, water, and whatever you need Go for walk if feeling it 30 minutes? :) Small routine exercises in the house.  2) Lets make sure we are incorporating protein and some fiber with our juices.  Protein: eggs, nuts/seeds, nut butters, etc.  Grains: triscuits, oats, whole wheat toast, whole fruit.

## 2023-09-15 NOTE — Progress Notes (Signed)
Medical Nutrition Therapy: 09/15/23  Primary concerns today: to lose weight   Referral diagnosis: e66.9  [facility] Employee Visit 3  NUTRITION ASSESSMENT   Clinical Medical Hx: reviewed Food Allergies: none at time of visit. Medications: phentermine  Labs: A1C 6.0 02/26/23 Notable Signs/Symptoms: N/A   Lifestyle & Dietary Hx 11/18: Pts weight today is 203.8 lb, down about 10 lbs since visit on 07/04/23. The pt states that she has been feeling successful with sticking to her meal prepping and dietary goals; she notes that she has not been consistent with physical activity goals and identifies that she wants to focus more on this aspect and that her main goal is to mostly "be healthier", and has a weight goal of < 200 lbs.  Pt notes that she stopped taking her birth control in September.  - Subjective assessment of typical dietary pattern is described under "Dietary History", below.   Holly Tran states she has had good energy levels, denies headaches, low energy, signs of extreme hunger.  Holly Tran states that she has been working a lot recently (5 days a week, often 12 hour shifts), which can be a barrier to committing to exercise goals- pt identifies "laziness"/lack of motivation after work. She also states that weekends are busy (meal prepping, church, errands, chores). Discussed with pt to identify strategies to help encourage commitment to activity goals (ex: prepping water, clothes etc. And placing in an obvious/convenient location to remind herself of her commitment to her goal; schedule the time to go on a walk or do small home exercises to make it part of her routine).   From 05/12/23: Body Composition Scale 05/12/2023  Current Body Weight 217.1  Total Body Fat % 39.5  Visceral Fat 9  Fat-Free Mass % 60.4   Total Body Water % 44.7  Muscle-Mass lbs 33.8  BMI 35.8  Body Fat Displacement          Torso  lbs 53.1         Left Leg  lbs 10.6         Right Leg  lbs 10.6         Left Arm  lbs  5.3         Right Arm   lbs 5.3   Estimated daily fluid intake:  64 oz/day of water, makes 32 oz of fruit juice (carrot, pineapple, orange/ginger, celery lemon cucumber, etc). Supplements: N/A Sleep:  Stress / self-care: low stress Current average weekly physical activity: ADL's  Dietary History:   Pt states that she will prep for lunch and dinner for work days. States she is not big on breakfast but will have juice and water and/or something small (granola bar or danish); always eats lunch around 12-1 pm; she may or may not have a small meal at end of day (avoids eating after 8 pm, off work at 7 pm) dinner may be just juice and/or small snack.    24-Hr Dietary Recall: Only assessed typical intake pattern this visit First Meal: light- juice and maybe granola bar Snack:  Second Meal: larger lunch, (meal prepped) Snack:  Third Meal: smaller than lunch (prepped), may skip depending on the day Snack: if skipped third meal, may have juice or small snack Beverages:    NUTRITION INTERVENTION  Nutrition education (E-1) on the following topics: 09/15/23 Discussed the impact of foods on blood sugar and how the addition of fiber/protein can reduce the impact of dietary sugar on our blood glucose levels.  Encouraged the patient to  incorporate a source of fiber and lean protein with juices, to minimize potential impact of sugar from juice on blood glucose. Encouraged the pt to continue with meal prepping and self-assessing for hunger level or signs of low blood sugar (fatigue, headaches, light headedness, extreme hunger) as this can indicate that we have gone too long without eating, which can have a negative impact on blood-sugar, mood, and appetite. Creation of balanced and diverse meals to increase the intake of nutrient-rich foods that provide essential vitamins, minerals, fiber, and phytonutrients Variety of Fruits and Vegetables: Aim for a colorful array of fruits and vegetables to ensure a  wide range of nutrients. Include a mix of leafy greens, berries, citrus fruits, cruciferous vegetables, and more. Whole Grains: Choose whole grains over refined grains. Examples include brown rice, quinoa, oats, whole wheat, and barley. Lean Proteins: Include lean sources of protein, such as poultry, fish, tofu, legumes, beans, lentils, and low-fat dairy products. Limit red and processed meats. Healthy Fats: Incorporate sources of healthy fats, including avocados, nuts, seeds, and olive oil. Limit saturated and trans fats found in fried and processed foods. Dairy or Dairy Alternatives: Choose low-fat or fat-free dairy products, or plant-based alternatives like almond or soy milk. Portion Control: Be mindful of portion sizes to avoid overeating. Pay attention to hunger and satisfaction cues. Limit Added Sugars: Minimize the consumption of sugary beverages, snacks, and desserts. Hydration: Drink plenty of water throughout the day. Limit sugary drinks and excessive caffeine intake. Meal Planning and Preparation: Plan and prepare meals ahead of time to make healthier choices more convenient. Regular Physical Activity: Combine a healthy diet with regular physical activity for overall well-being. Aim for at least 150 minutes of moderate-intensity aerobic exercise per week, along with strength training. Moderation and Balance: Enjoy treats and indulgent foods in moderation, emphasizing balance rather than strict restriction.  Handouts Provided Include  [employer] Plate Method  Learning Style & Readiness for Change Teaching method utilized: Visual & Auditory  Demonstrated degree of understanding via: Teach Back  Barriers to learning/adherence to lifestyle change: none identified   Goals Established by Pt  Previous Goals Great job on working toward your goals! Drink 100 ounces of fluid per day - in progress, continue Include more balanced meals - in progress, continue Go walking for 30-45 minutes around  the neighborhood at least 1 time per week- in progress Cook at home at least 2 times a week (Sunday and Wednesday are the days we talked about)- continue  New Goals:  1) Plan your exercise time: prep clothes, water, and whatever you need Go for walk if feeling it: aim for 15-30 minutes Small routine exercises in the house.  2) Lets make sure we are incorporating protein and some fiber with our juices.  Protein: eggs, nuts/seeds, nut butters, etc.  Grains/fiber: triscuits, oats, whole wheat toast, whole fruit or veg.  MONITORING & EVALUATION Dietary intake, weekly physical activity  Next Steps  Patient is to call to follow-up

## 2023-09-16 ENCOUNTER — Ambulatory Visit: Payer: 59 | Admitting: Dietician

## 2023-09-17 ENCOUNTER — Ambulatory Visit (INDEPENDENT_AMBULATORY_CARE_PROVIDER_SITE_OTHER): Payer: 59 | Admitting: Family Medicine

## 2023-09-17 ENCOUNTER — Encounter: Payer: Self-pay | Admitting: Family Medicine

## 2023-09-17 VITALS — BP 120/77 | HR 97 | Temp 98.3°F | Ht 65.0 in | Wt 204.0 lb

## 2023-09-17 DIAGNOSIS — E66811 Obesity, class 1: Secondary | ICD-10-CM | POA: Diagnosis not present

## 2023-09-17 DIAGNOSIS — Z7689 Persons encountering health services in other specified circumstances: Secondary | ICD-10-CM

## 2023-09-17 DIAGNOSIS — E6609 Other obesity due to excess calories: Secondary | ICD-10-CM | POA: Diagnosis not present

## 2023-09-17 DIAGNOSIS — Z6833 Body mass index (BMI) 33.0-33.9, adult: Secondary | ICD-10-CM

## 2023-09-17 MED ORDER — PHENTERMINE HCL 37.5 MG PO TABS: 37.5000 mg | ORAL_TABLET | Freq: Every day | ORAL | 0 refills | Status: DC

## 2023-09-17 NOTE — Progress Notes (Unsigned)
Established Patient Office Visit  Subjective    Patient ID: Holly Tran, female    DOB: 1995-11-18  Age: 27 y.o. MRN: [id number]  CC:  Chief Complaint  Patient presents with   Follow-up    6 month follow up    HPI Holly Tran presents for routine weight management. Patient denies acute complaints or concerns.   Outpatient Encounter Medications as of 09/17/2023  Medication Sig   cetirizine (ZYRTEC) 10 MG tablet Take 1 tablet (10 mg total) by mouth daily.   Norethindrone Acetate-Ethinyl Estrad-FE (LOESTRIN 24 FE) 1-20 MG-MCG(24) tablet Take 1 tablet by mouth daily.   [DISCONTINUED] phentermine (ADIPEX-P) 37.5 MG tablet Take 1 tablet (37.5 mg total) by mouth daily before breakfast.   ibuprofen (ADVIL) 800 MG tablet Take 1 tablet (800 mg total) by mouth 3 (three) times daily. (Patient not taking: Reported on 07/10/2023)   phentermine (ADIPEX-P) 37.5 MG tablet Take 1 tablet (37.5 mg total) by mouth daily before breakfast.   No facility-administered encounter medications on file as of 09/17/2023.    History reviewed. No pertinent past medical history.  Past Surgical History:  Procedure Laterality Date   WISDOM TOOTH EXTRACTION      Family History  Problem Relation Age of Onset   Diabetes Mother    Diabetes Brother    Diabetes Maternal Grandmother    Breast cancer Maternal Grandmother    Diabetes Maternal Grandfather    Colon cancer Paternal Grandmother     Social History   Socioeconomic History   Marital status: Single    Spouse name: Not on file   Number of children: Not on file   Years of education: Not on file   Highest education level: Master's degree (e.g., MA, MS, MEng, MEd, MSW, MBA)  Occupational History   Not on file  Tobacco Use   Smoking status: Former    Types: Cigarettes   Smokeless tobacco: Never   Tobacco comments:    smokes occasionally  Vaping Use   Vaping status: Never Used  Substance and Sexual Activity   Alcohol use: Yes    Comment:  occ   Drug use: No   Sexual activity: Yes    Partners: Male    Birth control/protection: Pill  Other Topics Concern   Not on file  Social History Narrative   Not on file   Social Determinants of Health   Financial Resource Strain: Low Risk  (09/13/2023)   Overall Financial Resource Strain (CARDIA)    Difficulty of Paying Living Expenses: Not very hard  Food Insecurity: No Food Insecurity (09/13/2023)   Hunger Vital Sign    Worried About Running Out of Food in the Last Year: Never true    Ran Out of Food in the Last Year: Never true  Transportation Needs: No Transportation Needs (09/13/2023)   PRAPARE - Administrator, Civil Service (Medical): No    Lack of Transportation (Non-Medical): No  Physical Activity: Unknown (09/13/2023)   Exercise Vital Sign    Days of Exercise per Week: 0 days    Minutes of Exercise per Session: Not on file  Stress: No Stress Concern Present (09/13/2023)   [employer] of Occupational Health - Occupational Stress Questionnaire    Feeling of Stress : Not at all  Social Connections: Moderately Isolated (09/13/2023)   Social Connection and Isolation Panel [NHANES]    Frequency of Communication with Friends and Family: More than three times a week    Frequency of Social Gatherings with  Friends and Family: Once a week    Attends Religious Services: More than 4 times per year    Active Member of [employer] or Organizations: No    Attends Engineer, structural: Not on file    Marital Status: Never married  Intimate Partner Violence: Not At Risk (07/10/2023)   Humiliation, Afraid, Rape, and Kick questionnaire    Fear of Current or Ex-Partner: No    Emotionally Abused: No    Physically Abused: No    Sexually Abused: No    Review of Systems  All other systems reviewed and are negative.       Objective    BP 120/77 (BP Location: Right Arm, Patient Position: Sitting, Cuff Size: Large)   Pulse 97   Temp 98.3 F (36.8 C) (Oral)    Ht 5\' 5"  (1.651 m)   Wt 204 lb (92.5 kg)   SpO2 98%   BMI 33.95 kg/m   Physical Exam Vitals and nursing note reviewed.  Constitutional:      General: She is not in acute distress.    Appearance: She is obese.  Cardiovascular:     Rate and Rhythm: Normal rate and regular rhythm.  Pulmonary:     Effort: Pulmonary effort is normal.     Breath sounds: Normal breath sounds.  Abdominal:     Palpations: Abdomen is soft.     Tenderness: There is no abdominal tenderness.  Neurological:     General: No focal deficit present.     Mental Status: She is alert and oriented to person, place, and time.         Assessment & Plan:   1. Encounter for weight management Phentermine prescribed. Goal is 3-5lbs/mo loss. Discussed dietary and activity options.   2. Class 1 obesity due to excess calories without serious comorbidity with body mass index (BMI) of 33.0 to 33.9 in adult  Return in about 4 weeks (around 10/15/2023) for follow up.   Holly Raymond, MD

## 2023-09-18 ENCOUNTER — Encounter: Payer: Self-pay | Admitting: Family Medicine

## 2023-09-19 DIAGNOSIS — F411 Generalized anxiety disorder: Secondary | ICD-10-CM | POA: Diagnosis not present

## 2023-09-24 DIAGNOSIS — F411 Generalized anxiety disorder: Secondary | ICD-10-CM | POA: Diagnosis not present

## 2023-09-30 DIAGNOSIS — F411 Generalized anxiety disorder: Secondary | ICD-10-CM | POA: Diagnosis not present

## 2023-10-06 DIAGNOSIS — F411 Generalized anxiety disorder: Secondary | ICD-10-CM | POA: Diagnosis not present

## 2023-10-14 DIAGNOSIS — F411 Generalized anxiety disorder: Secondary | ICD-10-CM | POA: Diagnosis not present

## 2023-10-17 ENCOUNTER — Ambulatory Visit: Payer: 59 | Admitting: Family Medicine

## 2023-10-17 VITALS — BP 129/86 | HR 88 | Temp 98.0°F | Resp 16 | Wt 199.4 lb

## 2023-10-17 DIAGNOSIS — E6609 Other obesity due to excess calories: Secondary | ICD-10-CM

## 2023-10-17 DIAGNOSIS — Z6833 Body mass index (BMI) 33.0-33.9, adult: Secondary | ICD-10-CM | POA: Diagnosis not present

## 2023-10-17 DIAGNOSIS — Z7689 Persons encountering health services in other specified circumstances: Secondary | ICD-10-CM

## 2023-10-17 DIAGNOSIS — E66811 Obesity, class 1: Secondary | ICD-10-CM | POA: Diagnosis not present

## 2023-10-17 MED ORDER — PHENTERMINE HCL 37.5 MG PO TABS: 37.5000 mg | ORAL_TABLET | Freq: Every day | ORAL | 0 refills | Status: DC

## 2023-10-20 ENCOUNTER — Encounter: Payer: Self-pay | Admitting: Family Medicine

## 2023-10-20 NOTE — Progress Notes (Signed)
Established Patient Office Visit  Subjective    Patient ID: Holly Tran, female    DOB: 1996/01/25  Age: 27 y.o. MRN: [id number]  CC:  Chief Complaint  Patient presents with   Weight Check    HPI Holly Tran presents for routine weight management. Patient reports doing well with present management.  Patient denies acute complaints or concerns.   Outpatient Encounter Medications as of 10/17/2023  Medication Sig   cetirizine (ZYRTEC) 10 MG tablet Take 1 tablet (10 mg total) by mouth daily.   ibuprofen (ADVIL) 800 MG tablet Take 1 tablet (800 mg total) by mouth 3 (three) times daily.   Norethindrone Acetate-Ethinyl Estrad-FE (LOESTRIN 24 FE) 1-20 MG-MCG(24) tablet Take 1 tablet by mouth daily.   [DISCONTINUED] phentermine (ADIPEX-P) 37.5 MG tablet Take 1 tablet (37.5 mg total) by mouth daily before breakfast.   phentermine (ADIPEX-P) 37.5 MG tablet Take 1 tablet (37.5 mg total) by mouth daily before breakfast.   No facility-administered encounter medications on file as of 10/17/2023.    History reviewed. No pertinent past medical history.  Past Surgical History:  Procedure Laterality Date   WISDOM TOOTH EXTRACTION      Family History  Problem Relation Age of Onset   Diabetes Mother    Diabetes Brother    Diabetes Maternal Grandmother    Breast cancer Maternal Grandmother    Diabetes Maternal Grandfather    Colon cancer Paternal Grandmother     Social History   Socioeconomic History   Marital status: Single    Spouse name: Not on file   Number of children: Not on file   Years of education: Not on file   Highest education level: Master's degree (e.g., MA, MS, MEng, MEd, MSW, MBA)  Occupational History   Not on file  Tobacco Use   Smoking status: Former    Types: Cigarettes   Smokeless tobacco: Never   Tobacco comments:    smokes occasionally  Vaping Use   Vaping status: Never Used  Substance and Sexual Activity   Alcohol use: Yes    Comment: occ   Drug  use: No   Sexual activity: Yes    Partners: Male    Birth control/protection: Pill  Other Topics Concern   Not on file  Social History Narrative   Not on file   Social Drivers of Health   Financial Resource Strain: Low Risk  (10/13/2023)   Overall Financial Resource Strain (CARDIA)    Difficulty of Paying Living Expenses: Not very hard  Food Insecurity: No Food Insecurity (10/13/2023)   Hunger Vital Sign    Worried About Running Out of Food in the Last Year: Never true    Ran Out of Food in the Last Year: Never true  Transportation Needs: No Transportation Needs (10/13/2023)   PRAPARE - Administrator, Civil Service (Medical): No    Lack of Transportation (Non-Medical): No  Physical Activity: Insufficiently Active (10/13/2023)   Exercise Vital Sign    Days of Exercise per Week: 1 day    Minutes of Exercise per Session: 20 min  Stress: No Stress Concern Present (10/13/2023)   [employer] of Occupational Health - Occupational Stress Questionnaire    Feeling of Stress : Not at all  Social Connections: Moderately Isolated (10/13/2023)   Social Connection and Isolation Panel [NHANES]    Frequency of Communication with Friends and Family: More than three times a week    Frequency of Social Gatherings with Friends and Family: Once  a week    Attends Religious Services: More than 4 times per year    Active Member of Clubs or Organizations: No    Attends Banker Meetings: Not on file    Marital Status: Never married  Intimate Partner Violence: Not At Risk (07/10/2023)   Humiliation, Afraid, Rape, and Kick questionnaire    Fear of Current or Ex-Partner: No    Emotionally Abused: No    Physically Abused: No    Sexually Abused: No    Review of Systems  All other systems reviewed and are negative.       Objective    BP 129/86   Pulse 88   Temp 98 F (36.7 C) (Oral)   Resp 16   Wt 199 lb 6.4 oz (90.4 kg)   SpO2 99%   BMI 33.18 kg/m    Physical Exam Vitals and nursing note reviewed.  Constitutional:      General: She is not in acute distress.    Appearance: She is obese.  Cardiovascular:     Rate and Rhythm: Normal rate and regular rhythm.  Pulmonary:     Effort: Pulmonary effort is normal.     Breath sounds: Normal breath sounds.  Abdominal:     Palpations: Abdomen is soft.     Tenderness: There is no abdominal tenderness.  Neurological:     General: No focal deficit present.     Mental Status: She is alert and oriented to person, place, and time.         Assessment & Plan:   Encounter for weight management  Class 1 obesity due to excess calories without serious comorbidity with body mass index (BMI) of 33.0 to 33.9 in adult  Other orders -     Phentermine HCl; Take 1 tablet (37.5 mg total) by mouth daily before breakfast.  Dispense: 30 tablet; Refill: 0     Return in about 4 weeks (around 11/14/2023) for follow up.   [name], MD

## 2023-11-06 DIAGNOSIS — F411 Generalized anxiety disorder: Secondary | ICD-10-CM | POA: Diagnosis not present

## 2023-11-21 ENCOUNTER — Ambulatory Visit (INDEPENDENT_AMBULATORY_CARE_PROVIDER_SITE_OTHER): Payer: Commercial Managed Care - PPO | Admitting: Family Medicine

## 2023-11-21 ENCOUNTER — Other Ambulatory Visit: Payer: Self-pay | Admitting: Family Medicine

## 2023-11-21 ENCOUNTER — Encounter: Payer: Self-pay | Admitting: Family Medicine

## 2023-11-21 ENCOUNTER — Other Ambulatory Visit (HOSPITAL_COMMUNITY)
Admission: RE | Admit: 2023-11-21 | Discharge: 2023-11-21 | Disposition: A | Discharge status: Home / Self Care | Payer: Commercial Managed Care - PPO | Source: Ambulatory Visit | Attending: Family Medicine | Admitting: Family Medicine

## 2023-11-21 VITALS — BP 130/81 | HR 92 | Temp 97.9°F | Resp 16 | Ht 65.0 in | Wt 192.4 lb

## 2023-11-21 DIAGNOSIS — Z7689 Persons encountering health services in other specified circumstances: Secondary | ICD-10-CM

## 2023-11-21 DIAGNOSIS — Z113 Encounter for screening for infections with a predominantly sexual mode of transmission: Secondary | ICD-10-CM

## 2023-11-21 DIAGNOSIS — E6609 Other obesity due to excess calories: Secondary | ICD-10-CM | POA: Diagnosis not present

## 2023-11-21 DIAGNOSIS — Z6833 Body mass index (BMI) 33.0-33.9, adult: Secondary | ICD-10-CM

## 2023-11-21 DIAGNOSIS — E66811 Obesity, class 1: Secondary | ICD-10-CM | POA: Diagnosis not present

## 2023-11-21 MED ORDER — PHENTERMINE HCL 37.5 MG PO TABS: 37.5000 mg | ORAL_TABLET | Freq: Every day | ORAL | 0 refills | Status: DC

## 2023-11-23 LAB — CERVICOVAGINAL ANCILLARY ONLY
Bacterial Vaginitis (gardnerella): NEGATIVE
Candida Glabrata: NEGATIVE
Candida Vaginitis: NEGATIVE
Chlamydia: NEGATIVE
Comment: NEGATIVE
Comment: NEGATIVE
Comment: NEGATIVE
Comment: NEGATIVE
Comment: NEGATIVE
Comment: NORMAL
Neisseria Gonorrhea: NEGATIVE
Trichomonas: NEGATIVE

## 2023-11-24 ENCOUNTER — Encounter: Payer: Self-pay | Admitting: Family Medicine

## 2023-11-24 NOTE — Progress Notes (Signed)
Established Patient Office Visit  Subjective    Patient ID: Holly Tran, female    DOB: 1996-05-08  Age: 28 y.o. MRN: [id number]  CC:  Chief Complaint  Patient presents with   Follow-up    STD Check    HPI Holly Tran presents for routine weight management appt. Patient also would like and std screening. She is sexually active but denies GU sx.   Outpatient Encounter Medications as of 11/21/2023  Medication Sig   cetirizine (ZYRTEC) 10 MG tablet Take 1 tablet (10 mg total) by mouth daily.   ibuprofen (ADVIL) 800 MG tablet Take 1 tablet (800 mg total) by mouth 3 (three) times daily.   Norethindrone Acetate-Ethinyl Estrad-FE (LOESTRIN 24 FE) 1-20 MG-MCG(24) tablet Take 1 tablet by mouth daily.   [DISCONTINUED] phentermine (ADIPEX-P) 37.5 MG tablet Take 1 tablet (37.5 mg total) by mouth daily before breakfast.   phentermine (ADIPEX-P) 37.5 MG tablet Take 1 tablet (37.5 mg total) by mouth daily before breakfast.   No facility-administered encounter medications on file as of 11/21/2023.    History reviewed. No pertinent past medical history.  Past Surgical History:  Procedure Laterality Date   WISDOM TOOTH EXTRACTION      Family History  Problem Relation Age of Onset   Diabetes Mother    Diabetes Brother    Diabetes Maternal Grandmother    Breast cancer Maternal Grandmother    Diabetes Maternal Grandfather    Colon cancer Paternal Grandmother     Social History   Socioeconomic History   Marital status: Single    Spouse name: Not on file   Number of children: Not on file   Years of education: Not on file   Highest education level: Master's degree (e.g., MA, MS, MEng, MEd, MSW, MBA)  Occupational History   Not on file  Tobacco Use   Smoking status: Former    Types: Cigarettes   Smokeless tobacco: Never   Tobacco comments:    smokes occasionally  Vaping Use   Vaping status: Never Used  Substance and Sexual Activity   Alcohol use: Yes    Comment: occ   Drug  use: No   Sexual activity: Yes    Partners: Male    Birth control/protection: Pill  Other Topics Concern   Not on file  Social History Narrative   Not on file   Social Drivers of Health   Financial Resource Strain: Low Risk  (10/13/2023)   Overall Financial Resource Strain (CARDIA)    Difficulty of Paying Living Expenses: Not very hard  Food Insecurity: No Food Insecurity (10/13/2023)   Hunger Vital Sign    Worried About Running Out of Food in the Last Year: Never true    Ran Out of Food in the Last Year: Never true  Transportation Needs: No Transportation Needs (10/13/2023)   PRAPARE - Administrator, Civil Service (Medical): No    Lack of Transportation (Non-Medical): No  Physical Activity: Insufficiently Active (10/13/2023)   Exercise Vital Sign    Days of Exercise per Week: 1 day    Minutes of Exercise per Session: 20 min  Stress: No Stress Concern Present (10/13/2023)   [employer] of Occupational Health - Occupational Stress Questionnaire    Feeling of Stress : Not at all  Social Connections: Moderately Isolated (10/13/2023)   Social Connection and Isolation Panel [NHANES]    Frequency of Communication with Friends and Family: More than three times a week    Frequency of Social  Gatherings with Friends and Family: Once a week    Attends Religious Services: More than 4 times per year    Active Member of [employer] or Organizations: No    Attends Engineer, structural: Not on file    Marital Status: Never married  Intimate Partner Violence: Not At Risk (07/10/2023)   Humiliation, Afraid, Rape, and Kick questionnaire    Fear of Current or Ex-Partner: No    Emotionally Abused: No    Physically Abused: No    Sexually Abused: No    Review of Systems  All other systems reviewed and are negative.       Objective    BP 130/81 (BP Location: Right Arm, Patient Position: Sitting, Cuff Size: Normal)   Pulse 92   Temp 97.9 F (36.6 C) (Oral)   Resp  16   Ht 5\' 5"  (1.651 m)   Wt 192 lb 6.4 oz (87.3 kg)   SpO2 98%   BMI 32.02 kg/m   Physical Exam Vitals and nursing note reviewed.  Constitutional:      General: She is not in acute distress.    Appearance: She is obese.  Cardiovascular:     Rate and Rhythm: Normal rate and regular rhythm.  Pulmonary:     Effort: Pulmonary effort is normal.     Breath sounds: Normal breath sounds.  Abdominal:     Palpations: Abdomen is soft.     Tenderness: There is no abdominal tenderness.  Neurological:     General: No focal deficit present.     Mental Status: She is alert and oriented to person, place, and time.         Assessment & Plan:   Encounter for weight management  Class 1 obesity due to excess calories without serious comorbidity with body mass index (BMI) of 33.0 to 33.9 in adult  Screening for STDs (sexually transmitted diseases) -     Cervicovaginal ancillary only  Other orders -     Phentermine HCl; Take 1 tablet (37.5 mg total) by mouth daily before breakfast.  Dispense: 30 tablet; Refill: 0     Return in about 4 weeks (around 12/19/2023) for follow up.   Holly Raymond, MD

## 2023-11-28 DIAGNOSIS — F411 Generalized anxiety disorder: Secondary | ICD-10-CM | POA: Diagnosis not present

## 2023-12-04 DIAGNOSIS — F411 Generalized anxiety disorder: Secondary | ICD-10-CM | POA: Diagnosis not present

## 2023-12-09 DIAGNOSIS — F411 Generalized anxiety disorder: Secondary | ICD-10-CM | POA: Diagnosis not present

## 2023-12-16 ENCOUNTER — Ambulatory Visit: Payer: Commercial Managed Care - PPO | Admitting: Family Medicine

## 2023-12-16 DIAGNOSIS — F411 Generalized anxiety disorder: Secondary | ICD-10-CM | POA: Diagnosis not present

## 2023-12-23 DIAGNOSIS — F411 Generalized anxiety disorder: Secondary | ICD-10-CM | POA: Diagnosis not present

## 2023-12-26 ENCOUNTER — Ambulatory Visit: Payer: Commercial Managed Care - PPO | Admitting: Family Medicine

## 2023-12-26 VITALS — BP 118/74 | HR 87 | Temp 98.0°F | Resp 16 | Ht 65.0 in | Wt 189.4 lb

## 2023-12-26 DIAGNOSIS — Z7689 Persons encountering health services in other specified circumstances: Secondary | ICD-10-CM

## 2023-12-26 DIAGNOSIS — Z6831 Body mass index (BMI) 31.0-31.9, adult: Secondary | ICD-10-CM

## 2023-12-26 DIAGNOSIS — E66811 Obesity, class 1: Secondary | ICD-10-CM | POA: Diagnosis not present

## 2023-12-26 DIAGNOSIS — E6609 Other obesity due to excess calories: Secondary | ICD-10-CM

## 2023-12-26 MED ORDER — PHENTERMINE HCL 37.5 MG PO TABS: 37.5000 mg | ORAL_TABLET | Freq: Every day | ORAL | 0 refills | Status: DC

## 2023-12-26 NOTE — Progress Notes (Signed)
 Established Patient Office Visit  Subjective    Patient ID: Holly Tran, female    DOB: 16-Mar-1996  Age: 28 y.o. MRN: [id number]  CC:  Chief Complaint  Patient presents with   Follow-up    4 week    HPI Holly Tran presents for routie weight management. Patient continues to do well with present management  Outpatient Encounter Medications as of 12/26/2023  Medication Sig   cetirizine (ZYRTEC) 10 MG tablet Take 1 tablet (10 mg total) by mouth daily.   ibuprofen (ADVIL) 800 MG tablet Take 1 tablet (800 mg total) by mouth 3 (three) times daily.   Norethindrone Acetate-Ethinyl Estrad-FE (LOESTRIN 24 FE) 1-20 MG-MCG(24) tablet Take 1 tablet by mouth daily.   phentermine (ADIPEX-P) 37.5 MG tablet Take 1 tablet (37.5 mg total) by mouth daily before breakfast.   [DISCONTINUED] phentermine (ADIPEX-P) 37.5 MG tablet Take 1 tablet (37.5 mg total) by mouth daily before breakfast.   No facility-administered encounter medications on file as of 12/26/2023.    History reviewed. No pertinent past medical history.  Past Surgical History:  Procedure Laterality Date   WISDOM TOOTH EXTRACTION      Family History  Problem Relation Age of Onset   Diabetes Mother    Diabetes Brother    Diabetes Maternal Grandmother    Breast cancer Maternal Grandmother    Diabetes Maternal Grandfather    Colon cancer Paternal Grandmother     Social History   Socioeconomic History   Marital status: Single    Spouse name: Not on file   Number of children: Not on file   Years of education: Not on file   Highest education level: Master's degree (e.g., MA, MS, MEng, MEd, MSW, MBA)  Occupational History   Not on file  Tobacco Use   Smoking status: Former    Types: Cigarettes   Smokeless tobacco: Never   Tobacco comments:    smokes occasionally  Vaping Use   Vaping status: Never Used  Substance and Sexual Activity   Alcohol use: Yes    Comment: occ   Drug use: No   Sexual activity: Yes     Partners: Male    Birth control/protection: Pill  Other Topics Concern   Not on file  Social History Narrative   Not on file   Social Drivers of Health   Financial Resource Strain: Low Risk  (10/13/2023)   Overall Financial Resource Strain (CARDIA)    Difficulty of Paying Living Expenses: Not very hard  Food Insecurity: No Food Insecurity (10/13/2023)   Hunger Vital Sign    Worried About Running Out of Food in the Last Year: Never true    Ran Out of Food in the Last Year: Never true  Transportation Needs: No Transportation Needs (10/13/2023)   PRAPARE - Administrator, Civil Service (Medical): No    Lack of Transportation (Non-Medical): No  Physical Activity: Insufficiently Active (10/13/2023)   Exercise Vital Sign    Days of Exercise per Week: 1 day    Minutes of Exercise per Session: 20 min  Stress: No Stress Concern Present (10/13/2023)   [employer] of Occupational Health - Occupational Stress Questionnaire    Feeling of Stress : Not at all  Social Connections: Moderately Isolated (10/13/2023)   Social Connection and Isolation Panel [NHANES]    Frequency of Communication with Friends and Family: More than three times a week    Frequency of Social Gatherings with Friends and Family: Once a week  Attends Religious Services: More than 4 times per year    Active Member of Clubs or Organizations: No    Attends Banker Meetings: Not on file    Marital Status: Never married  Intimate Partner Violence: Not At Risk (07/10/2023)   Humiliation, Afraid, Rape, and Kick questionnaire    Fear of Current or Ex-Partner: No    Emotionally Abused: No    Physically Abused: No    Sexually Abused: No    Review of Systems  All other systems reviewed and are negative.       Objective    BP 118/74   Pulse 87   Temp 98 F (36.7 C) (Oral)   Resp 16   Ht 5\' 5"  (1.651 m)   Wt 189 lb 6.4 oz (85.9 kg)   SpO2 97%   BMI 31.52 kg/m   Physical  Exam Vitals and nursing note reviewed.  Constitutional:      General: She is not in acute distress.    Appearance: She is obese.  Cardiovascular:     Rate and Rhythm: Normal rate and regular rhythm.  Pulmonary:     Effort: Pulmonary effort is normal.     Breath sounds: Normal breath sounds.  Abdominal:     Palpations: Abdomen is soft.     Tenderness: There is no abdominal tenderness.  Neurological:     General: No focal deficit present.     Mental Status: She is alert and oriented to person, place, and time.         Assessment & Plan:   Encounter for weight management  Class 1 obesity due to excess calories without serious comorbidity with body mass index (BMI) of 31.0 to 31.9 in adult  Other orders -     Phentermine HCl; Take 1 tablet (37.5 mg total) by mouth daily before breakfast.  Dispense: 30 tablet; Refill: 0   Patient doing well. Continue   Return in about 4 weeks (around 01/23/2024).   [name], MD

## 2023-12-29 ENCOUNTER — Encounter: Payer: Self-pay | Admitting: Family Medicine

## 2024-01-15 DIAGNOSIS — F411 Generalized anxiety disorder: Secondary | ICD-10-CM | POA: Diagnosis not present

## 2024-01-22 ENCOUNTER — Ambulatory Visit: Payer: Commercial Managed Care - PPO | Admitting: Family Medicine

## 2024-02-24 ENCOUNTER — Ambulatory Visit: Admitting: Family Medicine

## 2024-02-26 ENCOUNTER — Other Ambulatory Visit: Payer: Self-pay | Admitting: Obstetrics and Gynecology

## 2024-03-03 ENCOUNTER — Other Ambulatory Visit: Payer: Self-pay

## 2024-03-03 MED ORDER — NORETHIN ACE-ETH ESTRAD-FE 1-20 MG-MCG(24) PO TABS: 1.0000 | ORAL_TABLET | Freq: Every day | ORAL | 0 refills | Status: DC

## 2024-03-03 NOTE — Progress Notes (Signed)
 [facility] refill sent to pharmacy. Made pharmacy aware no more refills until AEX

## 2024-03-08 DIAGNOSIS — F411 Generalized anxiety disorder: Secondary | ICD-10-CM | POA: Diagnosis not present

## 2024-03-13 ENCOUNTER — Other Ambulatory Visit: Payer: Self-pay | Admitting: Obstetrics and Gynecology

## 2024-03-16 DIAGNOSIS — F411 Generalized anxiety disorder: Secondary | ICD-10-CM | POA: Diagnosis not present

## 2024-03-30 DIAGNOSIS — F411 Generalized anxiety disorder: Secondary | ICD-10-CM | POA: Diagnosis not present

## 2024-04-13 DIAGNOSIS — F411 Generalized anxiety disorder: Secondary | ICD-10-CM | POA: Diagnosis not present

## 2024-04-19 ENCOUNTER — Ambulatory Visit: Admitting: Family Medicine

## 2024-04-19 VITALS — BP 115/76 | HR 90

## 2024-04-19 DIAGNOSIS — Z7689 Persons encountering health services in other specified circumstances: Secondary | ICD-10-CM

## 2024-04-19 DIAGNOSIS — J302 Other seasonal allergic rhinitis: Secondary | ICD-10-CM

## 2024-04-19 DIAGNOSIS — E6609 Other obesity due to excess calories: Secondary | ICD-10-CM | POA: Diagnosis not present

## 2024-04-19 DIAGNOSIS — Z6831 Body mass index (BMI) 31.0-31.9, adult: Secondary | ICD-10-CM

## 2024-04-19 DIAGNOSIS — E66811 Obesity, class 1: Secondary | ICD-10-CM | POA: Diagnosis not present

## 2024-04-19 MED ORDER — CETIRIZINE HCL 10 MG PO TABS: 10.0000 mg | ORAL_TABLET | Freq: Every day | ORAL | 1 refills | Status: AC

## 2024-04-19 MED ORDER — PHENTERMINE HCL 37.5 MG PO TABS: 37.5000 mg | ORAL_TABLET | Freq: Every day | ORAL | 0 refills | Status: DC

## 2024-04-22 ENCOUNTER — Encounter: Payer: Self-pay | Admitting: Family Medicine

## 2024-04-22 NOTE — Progress Notes (Signed)
 Established Patient Office Visit  Subjective    Patient ID: Holly Tran, female    DOB: 14-Sep-1996  Age: 28 y.o. MRN: [id number]  CC:  Chief Complaint  Patient presents with   Medication Refill   Toe Pain    HPI Holly Tran presents for routine weight management. Patient also reports that she need refills of med for seasonal allergies.   Outpatient Encounter Medications as of 04/19/2024  Medication Sig   [DISCONTINUED] cetirizine  (ZYRTEC ) 10 MG tablet Take 1 tablet (10 mg total) by mouth daily.   [DISCONTINUED] phentermine  (ADIPEX-P ) 37.5 MG tablet Take 1 tablet (37.5 mg total) by mouth daily before breakfast.   cetirizine  (ZYRTEC ) 10 MG tablet Take 1 tablet (10 mg total) by mouth daily.   ibuprofen  (ADVIL ) 800 MG tablet Take 1 tablet (800 mg total) by mouth 3 (three) times daily.   Norethindrone Acetate-Ethinyl Estrad-FE (LOESTRIN 24 FE) 1-20 MG-MCG(24) tablet Take 1 tablet by mouth daily. Schedule annual exam before more refills.   phentermine  (ADIPEX-P ) 37.5 MG tablet Take 1 tablet (37.5 mg total) by mouth daily before breakfast.   No facility-administered encounter medications on file as of 04/19/2024.    No past medical history on file.  Past Surgical History:  Procedure Laterality Date   WISDOM TOOTH EXTRACTION      Family History  Problem Relation Age of Onset   Diabetes Mother    Diabetes Brother    Diabetes Maternal Grandmother    Breast cancer Maternal Grandmother    Diabetes Maternal Grandfather    Colon cancer Paternal Grandmother     Social History   Socioeconomic History   Marital status: Single    Spouse name: Not on file   Number of children: Not on file   Years of education: Not on file   Highest education level: Master's degree (e.g., MA, MS, MEng, MEd, MSW, MBA)  Occupational History   Not on file  Tobacco Use   Smoking status: Former    Types: Cigarettes   Smokeless tobacco: Never   Tobacco comments:    smokes occasionally  Vaping  Use   Vaping status: Never Used  Substance and Sexual Activity   Alcohol use: Yes    Comment: occ   Drug use: No   Sexual activity: Yes    Partners: Male    Birth control/protection: Pill  Other Topics Concern   Not on file  Social History Narrative   Not on file   Social Drivers of Health   Financial Resource Strain: Low Risk  (04/19/2024)   Overall Financial Resource Strain (CARDIA)    Difficulty of Paying Living Expenses: Not hard at all  Food Insecurity: No Food Insecurity (04/19/2024)   Hunger Vital Sign    Worried About Running Out of Food in the Last Year: Never true    Ran Out of Food in the Last Year: Never true  Transportation Needs: No Transportation Needs (04/19/2024)   PRAPARE - Administrator, Civil Service (Medical): No    Lack of Transportation (Non-Medical): No  Physical Activity: Inactive (04/19/2024)   Exercise Vital Sign    Days of Exercise per Week: 0 days    Minutes of Exercise per Session: Not on file  Stress: No Stress Concern Present (04/19/2024)   [employer] of Occupational Health - Occupational Stress Questionnaire    Feeling of Stress: Not at all  Social Connections: Moderately Integrated (04/19/2024)   Social Connection and Isolation Panel    Frequency of  Communication with Friends and Family: More than three times a week    Frequency of Social Gatherings with Friends and Family: Once a week    Attends Religious Services: 1 to 4 times per year    Active Member of [employer] or Organizations: Yes    Attends Banker Meetings: 1 to 4 times per year    Marital Status: Never married  Intimate Partner Violence: Not At Risk (07/10/2023)   Humiliation, Afraid, Rape, and Kick questionnaire    Fear of Current or Ex-Partner: No    Emotionally Abused: No    Physically Abused: No    Sexually Abused: No    Review of Systems  Endo/Heme/Allergies:  Positive for environmental allergies.  All other systems reviewed and are  negative.       Objective    BP 115/76 (BP Location: Right Arm, Patient Position: Sitting, Cuff Size: Large)   Pulse 90   LMP 04/07/2024   SpO2 95%   Physical Exam Vitals and nursing note reviewed.  Constitutional:      General: She is not in acute distress.    Appearance: She is obese.   Cardiovascular:     Rate and Rhythm: Normal rate and regular rhythm.  Pulmonary:     Effort: Pulmonary effort is normal.     Breath sounds: Normal breath sounds.  Abdominal:     Palpations: Abdomen is soft.     Tenderness: There is no abdominal tenderness.   Neurological:     General: No focal deficit present.     Mental Status: She is alert and oriented to person, place, and time.         Assessment & Plan:   Encounter for weight management  Class 1 obesity due to excess calories without serious comorbidity with body mass index (BMI) of 31.0 to 31.9 in adult  Seasonal allergies  Other orders -     Phentermine  HCl; Take 1 tablet (37.5 mg total) by mouth daily before breakfast.  Dispense: 30 tablet; Refill: 0 -     Cetirizine  HCl; Take 1 tablet (10 mg total) by mouth daily.  Dispense: 90 tablet; Refill: 1     Return in about 4 weeks (around 05/17/2024) for follow up, weight management.   Tanda Raguel SQUIBB, MD

## 2024-04-27 DIAGNOSIS — F411 Generalized anxiety disorder: Secondary | ICD-10-CM | POA: Diagnosis not present

## 2024-05-06 DIAGNOSIS — F411 Generalized anxiety disorder: Secondary | ICD-10-CM | POA: Diagnosis not present

## 2024-05-20 ENCOUNTER — Ambulatory Visit: Admitting: Family Medicine

## 2024-05-25 DIAGNOSIS — F411 Generalized anxiety disorder: Secondary | ICD-10-CM | POA: Diagnosis not present

## 2024-06-03 ENCOUNTER — Ambulatory Visit: Admitting: Family Medicine

## 2024-06-08 DIAGNOSIS — F411 Generalized anxiety disorder: Secondary | ICD-10-CM | POA: Diagnosis not present

## 2024-06-16 ENCOUNTER — Other Ambulatory Visit: Payer: Self-pay | Admitting: Family Medicine

## 2024-06-16 NOTE — Telephone Encounter (Signed)
 Copied from CRM [id number]. Topic: Clinical - Medication Refill >> Jun 16, 2024  8:51 AM Tobias L wrote: Medication: cetirizine  (ZYRTEC ) 10 MG tablet cetirizine  (ZYRTEC ) 10 MG tablet Has the patient contacted their pharmacy? Yes Told to contact the office for further refills.  This is the patient's preferred pharmacy:  [id number] - [place], [place] - [address] AT [facility] OF [address] [place] [address] Phone: [phone] Fax: [phone] Hours: Open 24 hours   Is this the correct pharmacy for this prescription? Yes  Has the prescription been filled recently? No  Is the patient out of the medication? Yes  Has the patient been seen for an appointment in the last year OR does the patient have an upcoming appointment? Yes  Can we respond through MyChart? Yes  Agent: Please be advised that Rx refills may take up to 3 business days. We ask that you follow-up with your pharmacy.

## 2024-06-17 NOTE — Telephone Encounter (Signed)
 Too soon for refill, LRF 04/19/24 for 90 and 1 RF.  Requested Prescriptions  Pending Prescriptions Disp Refills   cetirizine  (ZYRTEC ) 10 MG tablet 90 tablet 1    Sig: Take 1 tablet (10 mg total) by mouth daily.     Ear, Nose, and Throat:  Antihistamines 2 Failed - 06/17/2024  1:45 PM      Failed - Cr in normal range and within 360 days    No results found for: CREATININE, LABCREAU, LABCREA, POCCRE       Passed - Valid encounter within last 12 months    Recent Outpatient Visits           1 month ago Encounter for weight management   [facility], MD   5 months ago Encounter for weight management   [facility], MD   6 months ago Encounter for weight management   [facility], MD   8 months ago Encounter for weight management   [facility] [facility], MD   9 months ago Encounter for weight management   [place] [facility], MD

## 2024-06-24 DIAGNOSIS — F411 Generalized anxiety disorder: Secondary | ICD-10-CM | POA: Diagnosis not present

## 2024-07-02 ENCOUNTER — Encounter: Payer: Self-pay | Admitting: Family Medicine

## 2024-07-02 ENCOUNTER — Other Ambulatory Visit: Payer: Self-pay | Admitting: Family Medicine

## 2024-07-02 ENCOUNTER — Ambulatory Visit (INDEPENDENT_AMBULATORY_CARE_PROVIDER_SITE_OTHER): Admitting: Family Medicine

## 2024-07-02 VITALS — BP 137/82 | HR 80 | Ht 65.0 in | Wt 208.2 lb

## 2024-07-02 DIAGNOSIS — Z0000 Encounter for general adult medical examination without abnormal findings: Secondary | ICD-10-CM

## 2024-07-02 DIAGNOSIS — Z13228 Encounter for screening for other metabolic disorders: Secondary | ICD-10-CM

## 2024-07-02 DIAGNOSIS — Z13220 Encounter for screening for lipoid disorders: Secondary | ICD-10-CM

## 2024-07-02 DIAGNOSIS — Z1329 Encounter for screening for other suspected endocrine disorder: Secondary | ICD-10-CM | POA: Diagnosis not present

## 2024-07-02 DIAGNOSIS — Z130 Encounter for screening for diseases of the blood and blood-forming organs and certain disorders involving the immune mechanism: Secondary | ICD-10-CM

## 2024-07-02 NOTE — Progress Notes (Signed)
 Established Patient Office Visit  Subjective    Patient ID: Holly Tran, female    DOB: [birth year]  Age: 28 y.o. MRN: [id number]  CC:  Chief Complaint  Patient presents with   Annual Exam    HPI Shawny Teehan presents for routine annual exam. Patient denies acute complaints.   Outpatient Encounter Medications as of 07/02/2024  Medication Sig   cetirizine  (ZYRTEC ) 10 MG tablet Take 1 tablet (10 mg total) by mouth daily.   ibuprofen  (ADVIL ) 800 MG tablet Take 1 tablet (800 mg total) by mouth 3 (three) times daily.   Norethindrone Acetate-Ethinyl Estrad-FE (LOESTRIN 24 FE) 1-20 MG-MCG(24) tablet Take 1 tablet by mouth daily. Schedule annual exam before more refills.   phentermine  (ADIPEX-P ) 37.5 MG tablet Take 1 tablet (37.5 mg total) by mouth daily before breakfast.   No facility-administered encounter medications on file as of 07/02/2024.    History reviewed. No pertinent past medical history.  Past Surgical History:  Procedure Laterality Date   WISDOM TOOTH EXTRACTION      Family History  Problem Relation Age of Onset   Diabetes Mother    Diabetes Brother    Diabetes Maternal Grandmother    Breast cancer Maternal Grandmother    Diabetes Maternal Grandfather    Colon cancer Paternal Grandmother     Social History   Socioeconomic History   Marital status: Single    Spouse name: Not on file   Number of children: Not on file   Years of education: Not on file   Highest education level: Master's degree (e.g., MA, MS, MEng, MEd, MSW, MBA)  Occupational History   Not on file  Tobacco Use   Smoking status: Former    Types: Cigarettes   Smokeless tobacco: Never   Tobacco comments:    smokes occasionally  Vaping Use   Vaping status: Never Used  Substance and Sexual Activity   Alcohol use: Yes    Comment: occ   Drug use: No   Sexual activity: Yes    Partners: Male    Birth control/protection: Pill  Other Topics Concern   Not on file  Social History Narrative    Not on file   Social Drivers of Health   Financial Resource Strain: Low Risk  (04/19/2024)   Overall Financial Resource Strain (CARDIA)    Difficulty of Paying Living Expenses: Not hard at all  Food Insecurity: No Food Insecurity (04/19/2024)   Hunger Vital Sign    Worried About Running Out of Food in the Last Year: Never true    Ran Out of Food in the Last Year: Never true  Transportation Needs: No Transportation Needs (04/19/2024)   PRAPARE - Administrator, Civil Service (Medical): No    Lack of Transportation (Non-Medical): No  Physical Activity: Inactive (04/19/2024)   Exercise Vital Sign    Days of Exercise per Week: 0 days    Minutes of Exercise per Session: Not on file  Stress: No Stress Concern Present (04/19/2024)   [employer] of Occupational Health - Occupational Stress Questionnaire    Feeling of Stress: Not at all  Social Connections: Moderately Integrated (04/19/2024)   Social Connection and Isolation Panel    Frequency of Communication with Friends and Family: More than three times a week    Frequency of Social Gatherings with Friends and Family: Once a week    Attends Religious Services: 1 to 4 times per year    Active Member of [employer] or Organizations: Yes  Attends Banker Meetings: 1 to 4 times per year    Marital Status: Never married  Intimate Partner Violence: Not At Risk (07/10/2023)   Humiliation, Afraid, Rape, and Kick questionnaire    Fear of Current or Ex-Partner: No    Emotionally Abused: No    Physically Abused: No    Sexually Abused: No    Review of Systems  All other systems reviewed and are negative.       Objective    BP 137/82   Pulse 80   Ht 5' 5 (1.651 m)   Wt 208 lb 3.2 oz (94.4 kg)   LMP 07/02/2024 (Exact Date)   SpO2 98%   BMI 34.65 kg/m   Physical Exam Vitals and nursing note reviewed.  Constitutional:      General: She is not in acute distress.    Appearance: She is obese.  HENT:      Head: Normocephalic and atraumatic.     Right Ear: Tympanic membrane, ear canal and external ear normal.     Left Ear: Tympanic membrane, ear canal and external ear normal.     Nose: Nose normal.     Mouth/Throat:     Mouth: Mucous membranes are moist.     Pharynx: Oropharynx is clear.  Eyes:     Conjunctiva/sclera: Conjunctivae normal.     Pupils: Pupils are equal, round, and reactive to light.  Neck:     Thyroid: No thyromegaly.  Cardiovascular:     Rate and Rhythm: Normal rate and regular rhythm.     Heart sounds: Normal heart sounds. No murmur heard. Pulmonary:     Effort: Pulmonary effort is normal. No respiratory distress.     Breath sounds: Normal breath sounds.  Abdominal:     General: There is no distension.     Palpations: Abdomen is soft. There is no mass.     Tenderness: There is no abdominal tenderness.  Musculoskeletal:        General: Normal range of motion.     Cervical back: Normal range of motion and neck supple.  Skin:    General: Skin is warm and dry.  Neurological:     General: No focal deficit present.     Mental Status: She is alert and oriented to person, place, and time.  Psychiatric:        Mood and Affect: Mood normal.        Behavior: Behavior normal.         Assessment & Plan:   Annual physical exam -     Comprehensive metabolic panel with GFR  Screening for deficiency anemia -     CBC with Differential/Platelet  Screening for lipid disorders -     Lipid panel  Screening for endocrine/metabolic/immunity disorders -     Hemoglobin A1c     No follow-ups on file.   Tanda Raguel SQUIBB, MD

## 2024-07-03 LAB — HEMOGLOBIN A1C
Est. average glucose Bld gHb Est-mCnc: 117 mg/dL
Hgb A1c MFr Bld: 5.7 % — ABNORMAL HIGH (ref 4.8–5.6)

## 2024-07-03 LAB — COMPREHENSIVE METABOLIC PANEL WITH GFR
ALT: 9 IU/L (ref 0–32)
AST: 16 IU/L (ref 0–40)
Albumin: 4.1 g/dL (ref 4.0–5.0)
Alkaline Phosphatase: 89 IU/L (ref 44–121)
BUN/Creatinine Ratio: 14 (ref 9–23)
BUN: 12 mg/dL (ref 6–20)
Bilirubin Total: <0.2 mg/dL (ref 0.0–1.2)
CO2: 21 mmol/L (ref 20–29)
Calcium: 9.5 mg/dL (ref 8.7–10.2)
Chloride: 101 mmol/L (ref 96–106)
Creatinine, Ser: 0.83 mg/dL (ref 0.57–1.00)
Globulin, Total: 3.3 g/dL (ref 1.5–4.5)
Glucose: 83 mg/dL (ref 70–99)
Potassium: 4.5 mmol/L (ref 3.5–5.2)
Sodium: 141 mmol/L (ref 134–144)
Total Protein: 7.4 g/dL (ref 6.0–8.5)
eGFR: 97 mL/min/1.73 (ref >59)

## 2024-07-03 LAB — CBC WITH DIFFERENTIAL/PLATELET
Basophils Absolute: 0 x10E3/uL (ref 0.0–0.2)
Basos: 1 %
EOS (ABSOLUTE): 0.2 x10E3/uL (ref 0.0–0.4)
Eos: 2 %
Hematocrit: 37.4 % (ref 34.0–46.6)
Hemoglobin: 10.8 g/dL — ABNORMAL LOW (ref 11.1–15.9)
Immature Grans (Abs): 0 x10E3/uL (ref 0.0–0.1)
Immature Granulocytes: 0 %
Lymphocytes Absolute: 4 x10E3/uL — ABNORMAL HIGH (ref 0.7–3.1)
Lymphs: 52 %
MCH: 24.2 pg — ABNORMAL LOW (ref 26.6–33.0)
MCHC: 28.9 g/dL — ABNORMAL LOW (ref 31.5–35.7)
MCV: 84 fL (ref 79–97)
Monocytes Absolute: 0.5 x10E3/uL (ref 0.1–0.9)
Monocytes: 6 %
Neutrophils Absolute: 3 x10E3/uL (ref 1.4–7.0)
Neutrophils: 39 %
Platelets: 378 x10E3/uL (ref 150–450)
RBC: 4.46 x10E6/uL (ref 3.77–5.28)
RDW: 14.1 % (ref 11.7–15.4)
WBC: 7.7 x10E3/uL (ref 3.4–10.8)

## 2024-07-03 LAB — LIPID PANEL
Chol/HDL Ratio: 2.8 ratio (ref 0.0–4.4)
Cholesterol, Total: 171 mg/dL (ref 100–199)
HDL: 62 mg/dL (ref >39)
LDL Chol Calc (NIH): 89 mg/dL (ref 0–99)
Triglycerides: 110 mg/dL (ref 0–149)
VLDL Cholesterol Cal: 20 mg/dL (ref 5–40)

## 2024-07-14 ENCOUNTER — Ambulatory Visit: Payer: Self-pay | Admitting: Family Medicine

## 2024-07-15 ENCOUNTER — Encounter: Admitting: Family Medicine

## 2024-07-22 DIAGNOSIS — F411 Generalized anxiety disorder: Secondary | ICD-10-CM | POA: Diagnosis not present

## 2024-08-19 ENCOUNTER — Encounter: Payer: Self-pay | Admitting: Family Medicine

## 2024-08-19 ENCOUNTER — Ambulatory Visit: Admitting: Family Medicine

## 2024-08-19 VITALS — BP 117/75 | HR 86 | Ht 65.0 in | Wt 201.2 lb

## 2024-08-19 DIAGNOSIS — E66811 Obesity, class 1: Secondary | ICD-10-CM

## 2024-08-19 DIAGNOSIS — Z3041 Encounter for surveillance of contraceptive pills: Secondary | ICD-10-CM

## 2024-08-19 DIAGNOSIS — Z7689 Persons encountering health services in other specified circumstances: Secondary | ICD-10-CM

## 2024-08-19 DIAGNOSIS — Z6833 Body mass index (BMI) 33.0-33.9, adult: Secondary | ICD-10-CM

## 2024-08-19 MED ORDER — AUROVELA 24 FE 1-20 MG-MCG(24) PO TABS: 1.0000 | ORAL_TABLET | Freq: Every day | ORAL | 3 refills | Status: AC

## 2024-08-19 MED ORDER — PHENTERMINE HCL 37.5 MG PO TABS: 37.5000 mg | ORAL_TABLET | Freq: Every day | ORAL | 0 refills | Status: DC

## 2024-08-19 NOTE — Progress Notes (Signed)
 Established Patient Office Visit  Subjective    Patient ID: Holly Tran, female    DOB: October 11, 1996  Age: 28 y.o. MRN: [id number]  CC:  Chief Complaint  Patient presents with   Medical Management of Chronic Issues    HPI Holly Tran presents for routine weight management. Patient reports she continues to do well with present management. Patient also would like refills of OCP. She denies acute complaints.   Outpatient Encounter Medications as of 08/19/2024  Medication Sig   cetirizine  (ZYRTEC ) 10 MG tablet Take 1 tablet (10 mg total) by mouth daily.   ibuprofen  (ADVIL ) 800 MG tablet Take 1 tablet (800 mg total) by mouth 3 (three) times daily.   [DISCONTINUED] AUROVELA 24 FE 1-20 MG-MCG(24) tablet TAKE 1 TABLET BY MOUTH DAILY   [DISCONTINUED] phentermine  (ADIPEX-P ) 37.5 MG tablet TAKE 1 TABLET(37.5 MG) BY MOUTH DAILY BEFORE BREAKFAST   Norethindrone Acetate-Ethinyl Estrad-FE (AUROVELA 24 FE) 1-20 MG-MCG(24) tablet Take 1 tablet by mouth daily.   phentermine  (ADIPEX-P ) 37.5 MG tablet Take 1 tablet (37.5 mg total) by mouth daily before breakfast.   No facility-administered encounter medications on file as of 08/19/2024.    History reviewed. No pertinent past medical history.  Past Surgical History:  Procedure Laterality Date   WISDOM TOOTH EXTRACTION      Family History  Problem Relation Age of Onset   Diabetes Mother    Diabetes Brother    Diabetes Maternal Grandmother    Breast cancer Maternal Grandmother    Diabetes Maternal Grandfather    Colon cancer Paternal Grandmother     Social History   Socioeconomic History   Marital status: Single    Spouse name: Not on file   Number of children: Not on file   Years of education: Not on file   Highest education level: Master's degree (e.g., MA, MS, MEng, MEd, MSW, MBA)  Occupational History   Not on file  Tobacco Use   Smoking status: Former    Types: Cigarettes   Smokeless tobacco: Never   Tobacco comments:     smokes occasionally  Vaping Use   Vaping status: Never Used  Substance and Sexual Activity   Alcohol use: Yes    Comment: occ   Drug use: No   Sexual activity: Yes    Partners: Male    Birth control/protection: Pill  Other Topics Concern   Not on file  Social History Narrative   Not on file   Social Drivers of Health   Financial Resource Strain: Low Risk  (04/19/2024)   Overall Financial Resource Strain (CARDIA)    Difficulty of Paying Living Expenses: Not hard at all  Food Insecurity: No Food Insecurity (07/02/2024)   Hunger Vital Sign    Worried About Running Out of Food in the Last Year: Never true    Ran Out of Food in the Last Year: Never true  Transportation Needs: No Transportation Needs (04/19/2024)   PRAPARE - Administrator, Civil Service (Medical): No    Lack of Transportation (Non-Medical): No  Physical Activity: Inactive (04/19/2024)   Exercise Vital Sign    Days of Exercise per Week: 0 days    Minutes of Exercise per Session: Not on file  Stress: No Stress Concern Present (04/19/2024)   [employer] of Occupational Health - Occupational Stress Questionnaire    Feeling of Stress: Not at all  Social Connections: Moderately Integrated (04/19/2024)   Social Connection and Isolation Panel    Frequency of Communication  with Friends and Family: More than three times a week    Frequency of Social Gatherings with Friends and Family: Once a week    Attends Religious Services: 1 to 4 times per year    Active Member of [employer] or Organizations: Yes    Attends Banker Meetings: 1 to 4 times per year    Marital Status: Never married  Intimate Partner Violence: Not At Risk (07/02/2024)   Humiliation, Afraid, Rape, and Kick questionnaire    Fear of Current or Ex-Partner: No    Emotionally Abused: No    Physically Abused: No    Sexually Abused: No    Review of Systems  All other systems reviewed and are negative.       Objective    BP 117/75    Pulse 86   Ht 5' 5 (1.651 m)   Wt 201 lb 3.2 oz (91.3 kg)   LMP 07/24/2024   SpO2 97%   BMI 33.48 kg/m   Physical Exam Vitals and nursing note reviewed.  Constitutional:      General: She is not in acute distress.    Appearance: She is obese.  Cardiovascular:     Rate and Rhythm: Normal rate and regular rhythm.  Pulmonary:     Effort: Pulmonary effort is normal.     Breath sounds: Normal breath sounds.  Abdominal:     Palpations: Abdomen is soft.     Tenderness: There is no abdominal tenderness.  Neurological:     General: No focal deficit present.     Mental Status: She is alert and oriented to person, place, and time.         Assessment & Plan:   Encounter for weight management  Class 1 obesity due to excess calories without serious comorbidity with body mass index (BMI) of 33.0 to 33.9 in adult  Encounter for surveillance of contraceptive pills  Other orders -     Aurovela 24 FE; Take 1 tablet by mouth daily.  Dispense: 84 tablet; Refill: 3 -     Phentermine  HCl; Take 1 tablet (37.5 mg total) by mouth daily before breakfast.  Dispense: 30 tablet; Refill: 0     Return in about 4 weeks (around 09/16/2024) for follow up.   Tanda Raguel SQUIBB, MD

## 2024-08-24 DIAGNOSIS — F411 Generalized anxiety disorder: Secondary | ICD-10-CM | POA: Diagnosis not present

## 2024-09-07 DIAGNOSIS — F411 Generalized anxiety disorder: Secondary | ICD-10-CM | POA: Diagnosis not present

## 2024-09-21 DIAGNOSIS — F411 Generalized anxiety disorder: Secondary | ICD-10-CM | POA: Diagnosis not present

## 2024-10-01 DIAGNOSIS — F411 Generalized anxiety disorder: Secondary | ICD-10-CM | POA: Diagnosis not present

## 2024-10-05 ENCOUNTER — Other Ambulatory Visit (HOSPITAL_COMMUNITY)
Admission: RE | Admit: 2024-10-05 | Discharge: 2024-10-05 | Disposition: A | Discharge status: Home / Self Care | Source: Ambulatory Visit | Attending: Family Medicine | Admitting: Family Medicine

## 2024-10-05 ENCOUNTER — Encounter: Payer: Self-pay | Admitting: Family Medicine

## 2024-10-05 ENCOUNTER — Ambulatory Visit: Admitting: Family Medicine

## 2024-10-05 VITALS — BP 131/75 | HR 102 | Ht 65.0 in | Wt 192.4 lb

## 2024-10-05 DIAGNOSIS — Z6832 Body mass index (BMI) 32.0-32.9, adult: Secondary | ICD-10-CM | POA: Diagnosis not present

## 2024-10-05 DIAGNOSIS — Z113 Encounter for screening for infections with a predominantly sexual mode of transmission: Secondary | ICD-10-CM

## 2024-10-05 DIAGNOSIS — Z114 Encounter for screening for human immunodeficiency virus [HIV]: Secondary | ICD-10-CM

## 2024-10-05 DIAGNOSIS — Z7689 Persons encountering health services in other specified circumstances: Secondary | ICD-10-CM

## 2024-10-05 DIAGNOSIS — E6609 Other obesity due to excess calories: Secondary | ICD-10-CM | POA: Diagnosis not present

## 2024-10-05 DIAGNOSIS — E66811 Obesity, class 1: Secondary | ICD-10-CM | POA: Diagnosis not present

## 2024-10-05 MED ORDER — PHENTERMINE HCL 37.5 MG PO TABS: 37.5000 mg | ORAL_TABLET | Freq: Every day | ORAL | 0 refills | Status: DC

## 2024-10-06 ENCOUNTER — Ambulatory Visit: Payer: Self-pay | Admitting: Family Medicine

## 2024-10-06 ENCOUNTER — Encounter: Payer: Self-pay | Admitting: Family Medicine

## 2024-10-06 LAB — CERVICOVAGINAL ANCILLARY ONLY
Bacterial Vaginitis (gardnerella): NEGATIVE
Candida Glabrata: NEGATIVE
Candida Vaginitis: NEGATIVE
Chlamydia: NEGATIVE
Comment: NEGATIVE
Comment: NEGATIVE
Comment: NEGATIVE
Comment: NEGATIVE
Comment: NEGATIVE
Comment: NORMAL
Neisseria Gonorrhea: NEGATIVE
Trichomonas: NEGATIVE

## 2024-10-06 LAB — HIV ANTIBODY (ROUTINE TESTING W REFLEX): HIV Screen 4th Generation wRfx: NONREACTIVE

## 2024-10-06 NOTE — Progress Notes (Signed)
 Established Patient Office Visit  Subjective    Patient ID: Holly Tran, female    DOB: May 12, 1996  Age: 28 y.o. MRN: [id number]  CC:  Chief Complaint  Patient presents with   Medical Management of Chronic Issues    Pt would like std panel and hiv testing.     HPI Via Hausner presents for routine weight management. She is doing well with present management. Patient also would like STD testing. She denies GU sx.   Outpatient Encounter Medications as of 10/05/2024  Medication Sig   cetirizine  (ZYRTEC ) 10 MG tablet Take 1 tablet (10 mg total) by mouth daily.   Norethindrone Acetate-Ethinyl Estrad-FE (AUROVELA  24 FE) 1-20 MG-MCG(24) tablet Take 1 tablet by mouth daily.   [DISCONTINUED] phentermine  (ADIPEX-P ) 37.5 MG tablet Take 1 tablet (37.5 mg total) by mouth daily before breakfast.   ibuprofen  (ADVIL ) 800 MG tablet Take 1 tablet (800 mg total) by mouth 3 (three) times daily. (Patient not taking: Reported on 10/05/2024)   phentermine  (ADIPEX-P ) 37.5 MG tablet Take 1 tablet (37.5 mg total) by mouth daily before breakfast.   No facility-administered encounter medications on file as of 10/05/2024.    History reviewed. No pertinent past medical history.  Past Surgical History:  Procedure Laterality Date   WISDOM TOOTH EXTRACTION      Family History  Problem Relation Age of Onset   Diabetes Mother    Diabetes Brother    Diabetes Maternal Grandmother    Breast cancer Maternal Grandmother    Diabetes Maternal Grandfather    Colon cancer Paternal Grandmother     Social History   Socioeconomic History   Marital status: Single    Spouse name: Not on file   Number of children: Not on file   Years of education: Not on file   Highest education level: Master's degree (e.g., MA, MS, MEng, MEd, MSW, MBA)  Occupational History   Not on file  Tobacco Use   Smoking status: Former    Types: Cigarettes   Smokeless tobacco: Never   Tobacco comments:    smokes occasionally   Vaping Use   Vaping status: Never Used  Substance and Sexual Activity   Alcohol use: Yes    Comment: occ   Drug use: No   Sexual activity: Yes    Partners: Male    Birth control/protection: Pill  Other Topics Concern   Not on file  Social History Narrative   Not on file   Social Drivers of Health   Financial Resource Strain: Low Risk  (10/04/2024)   Overall Financial Resource Strain (CARDIA)    Difficulty of Paying Living Expenses: Not hard at all  Food Insecurity: No Food Insecurity (10/04/2024)   Hunger Vital Sign    Worried About Running Out of Food in the Last Year: Never true    Ran Out of Food in the Last Year: Never true  Transportation Needs: No Transportation Needs (10/04/2024)   PRAPARE - Administrator, Civil Service (Medical): No    Lack of Transportation (Non-Medical): No  Physical Activity: Insufficiently Active (10/04/2024)   Exercise Vital Sign    Days of Exercise per Week: 1 day    Minutes of Exercise per Session: 10 min  Stress: No Stress Concern Present (10/04/2024)   [employer] of Occupational Health - Occupational Stress Questionnaire    Feeling of Stress: Not at all  Social Connections: Moderately Integrated (10/04/2024)   Social Connection and Isolation Panel    Frequency of  Communication with Friends and Family: More than three times a week    Frequency of Social Gatherings with Friends and Family: Once a week    Attends Religious Services: More than 4 times per year    Active Member of [employer] or Organizations: Yes    Attends Banker Meetings: More than 4 times per year    Marital Status: Never married  Intimate Partner Violence: Not At Risk (07/02/2024)   Humiliation, Afraid, Rape, and Kick questionnaire    Fear of Current or Ex-Partner: No    Emotionally Abused: No    Physically Abused: No    Sexually Abused: No    Review of Systems  All other systems reviewed and are negative.       Objective    BP 131/75    Pulse (!) 102   Ht 5' 5 (1.651 m)   Wt 192 lb 6.4 oz (87.3 kg)   LMP 09/12/2024 (Exact Date)   SpO2 99%   BMI 32.02 kg/m   Physical Exam Vitals and nursing note reviewed.  Constitutional:      General: She is not in acute distress.    Appearance: She is obese.  Cardiovascular:     Rate and Rhythm: Normal rate and regular rhythm.  Pulmonary:     Effort: Pulmonary effort is normal.     Breath sounds: Normal breath sounds.  Abdominal:     Palpations: Abdomen is soft.     Tenderness: There is no abdominal tenderness.  Neurological:     General: No focal deficit present.     Mental Status: She is alert and oriented to person, place, and time.         Assessment & Plan:   Screening for STDs (sexually transmitted diseases) -     Cervicovaginal ancillary only  Screening for HIV (human immunodeficiency virus) -     HIV Antibody (routine testing w rflx)  Encounter for weight management  Class 1 obesity due to excess calories without serious comorbidity with body mass index (BMI) of 32.0 to 32.9 in adult  Other orders -     Phentermine  HCl; Take 1 tablet (37.5 mg total) by mouth daily before breakfast.  Dispense: 30 tablet; Refill: 0     Return in about 4 weeks (around 11/02/2024) for follow up, weight management.   Tanda Raguel SQUIBB, MD

## 2024-10-19 DIAGNOSIS — F411 Generalized anxiety disorder: Secondary | ICD-10-CM | POA: Diagnosis not present

## 2024-11-02 ENCOUNTER — Ambulatory Visit: Admitting: Family Medicine

## 2024-12-02 ENCOUNTER — Ambulatory Visit: Admitting: Family Medicine

## 2024-12-02 ENCOUNTER — Encounter: Payer: Self-pay | Admitting: Family Medicine

## 2024-12-02 VITALS — BP 118/80 | HR 98 | Ht 65.0 in | Wt 192.0 lb

## 2024-12-02 DIAGNOSIS — E6609 Other obesity due to excess calories: Secondary | ICD-10-CM

## 2024-12-02 DIAGNOSIS — Z7689 Persons encountering health services in other specified circumstances: Secondary | ICD-10-CM

## 2024-12-02 MED ORDER — PHENTERMINE HCL 37.5 MG PO TABS: 37.5000 mg | ORAL_TABLET | Freq: Every day | ORAL | 0 refills | Status: AC

## 2024-12-02 NOTE — Progress Notes (Unsigned)
 "  Established Patient Office Visit  Subjective    Patient ID: Holly Tran, female    DOB: December 14, 1995  Age: 29 y.o. MRN: [id number]  CC:  Chief Complaint  Patient presents with   Medical Management of Chronic Issues    HPI Holly Tran presents for routine weight management. Patient reports that she has not been doing well with activity and dietary recommendations over the holiday.   Outpatient Encounter Medications as of 12/02/2024  Medication Sig   cetirizine  (ZYRTEC ) 10 MG tablet Take 1 tablet (10 mg total) by mouth daily.   Norethindrone Acetate-Ethinyl Estrad-FE (AUROVELA  24 FE) 1-20 MG-MCG(24) tablet Take 1 tablet by mouth daily.   [DISCONTINUED] phentermine  (ADIPEX-P ) 37.5 MG tablet Take 1 tablet (37.5 mg total) by mouth daily before breakfast.   ibuprofen  (ADVIL ) 800 MG tablet Take 1 tablet (800 mg total) by mouth 3 (three) times daily. (Patient not taking: Reported on 10/05/2024)   phentermine  (ADIPEX-P ) 37.5 MG tablet Take 1 tablet (37.5 mg total) by mouth daily before breakfast.   No facility-administered encounter medications on file as of 12/02/2024.    History reviewed. No pertinent past medical history.  Past Surgical History:  Procedure Laterality Date   WISDOM TOOTH EXTRACTION      Family History  Problem Relation Age of Onset   Diabetes Mother    Diabetes Brother    Diabetes Maternal Grandmother    Breast cancer Maternal Grandmother    Diabetes Maternal Grandfather    Colon cancer Paternal Grandmother     Social History   Socioeconomic History   Marital status: Single    Spouse name: Not on file   Number of children: Not on file   Years of education: Not on file   Highest education level: Master's degree (e.g., MA, MS, MEng, MEd, MSW, MBA)  Occupational History   Not on file  Tobacco Use   Smoking status: Former    Types: Cigarettes   Smokeless tobacco: Never   Tobacco comments:    smokes occasionally  Vaping Use   Vaping status: Never Used   Substance and Sexual Activity   Alcohol use: Yes    Comment: occ   Drug use: No   Sexual activity: Yes    Partners: Male    Birth control/protection: Pill  Other Topics Concern   Not on file  Social History Narrative   Not on file   Social Drivers of Health   Tobacco Use: Medium Risk (12/02/2024)   Patient History    Smoking Tobacco Use: Former    Smokeless Tobacco Use: Never    Passive Exposure: Not on Actuary Strain: Low Risk (10/04/2024)   Overall Financial Resource Strain (CARDIA)    Difficulty of Paying Living Expenses: Not hard at all  Food Insecurity: No Food Insecurity (10/04/2024)   Epic    Worried About Radiation Protection Practitioner of Food in the Last Year: Never true    Ran Out of Food in the Last Year: Never true  Transportation Needs: No Transportation Needs (10/04/2024)   Epic    Lack of Transportation (Medical): No    Lack of Transportation (Non-Medical): No  Physical Activity: Sufficiently Active (12/02/2024)   Exercise Vital Sign    Days of Exercise per Week: 5 days    Minutes of Exercise per Session: 40 min  Recent Concern: Physical Activity - Insufficiently Active (10/04/2024)   Exercise Vital Sign    Days of Exercise per Week: 1 day    Minutes of  Exercise per Session: 10 min  Stress: No Stress Concern Present (10/04/2024)   [employer] of Occupational Health - Occupational Stress Questionnaire    Feeling of Stress: Not at all  Social Connections: Moderately Integrated (10/04/2024)   Social Connection and Isolation Panel    Frequency of Communication with Friends and Family: More than three times a week    Frequency of Social Gatherings with Friends and Family: Once a week    Attends Religious Services: More than 4 times per year    Active Member of Clubs or Organizations: Yes    Attends Banker Meetings: More than 4 times per year    Marital Status: Never married  Intimate Partner Violence: Not At Risk (07/02/2024)   Epic    Fear of  Current or Ex-Partner: No    Emotionally Abused: No    Physically Abused: No    Sexually Abused: No  Depression (PHQ2-9): Low Risk (07/02/2024)   Depression (PHQ2-9)    PHQ-2 Score: 0  Alcohol Screen: Low Risk (10/04/2024)   Alcohol Screen    Last Alcohol Screening Score (AUDIT): 1  Housing: Low Risk (10/04/2024)   Epic    Unable to Pay for Housing in the Last Year: No    Number of Times Moved in the Last Year: 0    Homeless in the Last Year: No  Utilities: Not At Risk (07/02/2024)   Epic    Threatened with loss of utilities: No  Health Literacy: Adequate Health Literacy (07/10/2023)   B1300 Health Literacy    Frequency of need for help with medical instructions: Never    Review of Systems  All other systems reviewed and are negative.       Objective    BP 118/80   Pulse 98   Ht 5' 5 (1.651 m)   Wt 192 lb (87.1 kg)   LMP 12/02/2024 (Exact Date)   SpO2 100%   BMI 31.95 kg/m   Physical Exam Vitals and nursing note reviewed.  Constitutional:      General: She is not in acute distress.    Appearance: She is obese.  Cardiovascular:     Rate and Rhythm: Normal rate and regular rhythm.  Pulmonary:     Effort: Pulmonary effort is normal.     Breath sounds: Normal breath sounds.  Abdominal:     Palpations: Abdomen is soft.     Tenderness: There is no abdominal tenderness.  Neurological:     General: No focal deficit present.     Mental Status: She is alert and oriented to person, place, and time.         Assessment & Plan:   Encounter for weight management  Class 1 obesity due to excess calories without serious comorbidity with body mass index (BMI) of 32.0 to 32.9 in adult  Other orders -     Phentermine  HCl; Take 1 tablet (37.5 mg total) by mouth daily before breakfast.  Dispense: 30 tablet; Refill: 0   Will restart phentermine . Patient to consider if she would like to change to wegovy.   No follow-ups on file.   Tanda Raguel SQUIBB, MD  "

## 2024-12-03 ENCOUNTER — Encounter: Payer: Self-pay | Admitting: Family Medicine

## 2024-12-31 ENCOUNTER — Ambulatory Visit: Payer: Self-pay | Admitting: Family Medicine
